# Patient Record
Sex: Male | Born: 1960 | Race: White | Hispanic: No | Marital: Married | Smoking: Never smoker
Health system: Southern US, Community
[De-identification: ages and names within clinical notes are randomized; demographics above are authoritative.]

## PROBLEM LIST (undated history)

## (undated) DIAGNOSIS — N200 Calculus of kidney: Secondary | ICD-10-CM

## (undated) DIAGNOSIS — I10 Essential (primary) hypertension: Secondary | ICD-10-CM

## (undated) DIAGNOSIS — Z87820 Personal history of traumatic brain injury: Secondary | ICD-10-CM

## (undated) HISTORY — DX: Personal history of traumatic brain injury: Z87.820

## (undated) HISTORY — PX: KIDNEY STONE SURGERY: SHX686

## (undated) HISTORY — DX: Essential (primary) hypertension: I10

---

## 2005-11-11 ENCOUNTER — Emergency Department (HOSPITAL_COMMUNITY): Admission: EM | Admit: 2005-11-11 | Discharge: 2005-11-11 | Payer: Self-pay | Admitting: Emergency Medicine

## 2009-06-19 ENCOUNTER — Emergency Department (HOSPITAL_COMMUNITY): Admission: EM | Admit: 2009-06-19 | Discharge: 2009-06-19 | Payer: Self-pay | Admitting: Emergency Medicine

## 2009-06-27 ENCOUNTER — Ambulatory Visit (HOSPITAL_COMMUNITY): Admission: RE | Admit: 2009-06-27 | Discharge: 2009-06-27 | Payer: Self-pay | Admitting: Urology

## 2010-07-22 LAB — DIFFERENTIAL
Eosinophils Absolute: 0 10*3/uL (ref 0.0–0.7)
Lymphs Abs: 0.4 10*3/uL — ABNORMAL LOW (ref 0.7–4.0)
Monocytes Relative: 4 % (ref 3–12)
Neutro Abs: 9.7 10*3/uL — ABNORMAL HIGH (ref 1.7–7.7)
Neutrophils Relative %: 92 % — ABNORMAL HIGH (ref 43–77)

## 2010-07-22 LAB — BASIC METABOLIC PANEL
Calcium: 8.9 mg/dL (ref 8.4–10.5)
Chloride: 107 mEq/L (ref 96–112)
Glucose, Bld: 154 mg/dL — ABNORMAL HIGH (ref 70–99)
Potassium: 4 mEq/L (ref 3.5–5.1)
Sodium: 137 mEq/L (ref 135–145)

## 2010-07-22 LAB — URINALYSIS, ROUTINE W REFLEX MICROSCOPIC
Specific Gravity, Urine: 1.016 (ref 1.005–1.030)
pH: 5.5 (ref 5.0–8.0)

## 2010-07-22 LAB — CBC
HCT: 43.4 % (ref 39.0–52.0)
MCV: 89.2 fL (ref 78.0–100.0)
RBC: 4.87 MIL/uL (ref 4.22–5.81)

## 2013-09-09 ENCOUNTER — Encounter (HOSPITAL_COMMUNITY): Payer: Self-pay | Admitting: Pharmacy Technician

## 2013-09-10 ENCOUNTER — Encounter (HOSPITAL_COMMUNITY): Payer: Self-pay

## 2013-09-10 ENCOUNTER — Encounter (HOSPITAL_COMMUNITY)
Admission: RE | Admit: 2013-09-10 | Discharge: 2013-09-10 | Disposition: A | Discharge status: Home / Self Care | Payer: PRIVATE HEALTH INSURANCE | Source: Ambulatory Visit | Attending: Orthopedic Surgery | Admitting: Orthopedic Surgery

## 2013-09-10 DIAGNOSIS — Z01812 Encounter for preprocedural laboratory examination: Secondary | ICD-10-CM | POA: Insufficient documentation

## 2013-09-10 LAB — CBC
HEMATOCRIT: 43.2 % (ref 39.0–52.0)
HEMOGLOBIN: 15 g/dL (ref 13.0–17.0)
MCH: 30.4 pg (ref 26.0–34.0)
MCHC: 34.7 g/dL (ref 30.0–36.0)
MCV: 87.4 fL (ref 78.0–100.0)
PLATELETS: 177 10*3/uL (ref 150–400)
RBC: 4.94 MIL/uL (ref 4.22–5.81)
RDW: 12.6 % (ref 11.5–15.5)
WBC: 3.8 10*3/uL — AB (ref 4.0–10.5)

## 2013-09-10 LAB — BASIC METABOLIC PANEL
BUN: 14 mg/dL (ref 6–23)
CALCIUM: 9.2 mg/dL (ref 8.4–10.5)
CO2: 26 meq/L (ref 19–32)
CREATININE: 0.93 mg/dL (ref 0.50–1.35)
Chloride: 105 mEq/L (ref 96–112)
Glucose, Bld: 101 mg/dL — ABNORMAL HIGH (ref 70–99)
POTASSIUM: 4.4 meq/L (ref 3.7–5.3)
Sodium: 141 mEq/L (ref 137–147)

## 2013-09-10 LAB — TYPE AND SCREEN
ABO/RH(D): O NEG
ANTIBODY SCREEN: NEGATIVE

## 2013-09-10 NOTE — Progress Notes (Signed)
Patient denied having any cardiac or pulmonary issues. Patient also denied having a PCP.

## 2013-09-10 NOTE — Pre-Procedure Instructions (Signed)
Jerry Turner  09/10/2013   Your procedure is scheduled on:  Friday Sep 20, 2013 at 10:30 AM.  [facility] Short Stay Entrance "A"  Admitting at 8:30 AM.  Call this number if you have problems the morning of surgery: [phone]   Remember:   Do not eat food or drink liquids after midnight.   Take these medicines the morning of surgery with A SIP OF WATER: NONE   Do not wear jewelry.  Do not wear lotions, powders, or colgone. You may NOT wear deodorant.  Men may shave face and neck.  Do not bring valuables to the hospital.  [facility] is not responsible for any belongings or valuables.               Contacts, dentures or bridgework may not be worn into surgery.  Leave suitcase in the car. After surgery it may be brought to your room.  For patients admitted to the hospital, discharge time is determined by your treatment team.               Patients discharged the day of surgery will not be allowed to drive home.  Name and phone number of your driver: Family/Friend  Special Instructions: Shower using CHG soap the night before and the morning of your surgery   Please read over the following fact sheets that you were given: Pain Booklet, Coughing and Deep Breathing, Blood Transfusion Information and Surgical Site Infection Prevention

## 2013-09-11 LAB — ABO/RH: ABO/RH(D): O NEG

## 2013-09-11 NOTE — H&P (Signed)
Jerry Turner is an 53 y.o. male.    Chief Complaint: right shoulder pain  HPI: Pt is a 53 y.o. male complaining of right shoulder pain for multiple years. Pain had continually increased since the beginning. X-rays in the clinic show end-stage arthritic changes of the right shoulder. Pt has tried various conservative treatments which have failed to alleviate their symptoms, including injections and therapy. Various options are discussed with the patient. Risks, benefits and expectations were discussed with the patient. Patient understand the risks, benefits and expectations and wishes to proceed with surgery.   PCP:  No primary provider on file.  D/C Plans:  Home with HHPT  PMH: No past medical history on file.  PSH: Past Surgical History  Procedure Laterality Date  . Kidney stone surgery      Social History:  reports that he has never smoked. He does not have any smokeless tobacco history on file. He reports that he drinks alcohol. He reports that he does not use illicit drugs.  Allergies:  No Known Allergies  Medications: No current facility-administered medications for this encounter.   No current outpatient prescriptions on file.    Results for orders placed during the hospital encounter of 09/10/13 (from the past 48 hour(s))  CBC     Status: Abnormal   Collection Time    09/10/13 12:28 PM      Result Value Ref Range   WBC 3.8 (*) 4.0 - 10.5 K/uL   RBC 4.94  4.22 - 5.81 MIL/uL   Hemoglobin 15.0  13.0 - 17.0 g/dL   HCT 43.2  39.0 - 52.0 %   MCV 87.4  78.0 - 100.0 fL   MCH 30.4  26.0 - 34.0 pg   MCHC 34.7  30.0 - 36.0 g/dL   RDW 12.6  11.5 - 15.5 %   Platelets 177  150 - 400 K/uL  BASIC METABOLIC PANEL     Status: Abnormal   Collection Time    09/10/13 12:28 PM      Result Value Ref Range   Sodium 141  137 - 147 mEq/L   Potassium 4.4  3.7 - 5.3 mEq/L   Chloride 105  96 - 112 mEq/L   CO2 26  19 - 32 mEq/L   Glucose, Bld 101 (*) 70 - 99 mg/dL   BUN 14  6 - 23  mg/dL   Creatinine, Ser 0.93  0.50 - 1.35 mg/dL   Calcium 9.2  8.4 - 10.5 mg/dL   GFR calc non Af Amer >90  >90 mL/min   GFR calc Af Amer >90  >90 mL/min   Comment: (NOTE)     The eGFR has been calculated using the CKD EPI equation.     This calculation has not been validated in all clinical situations.     eGFR's persistently <90 mL/min signify possible Chronic Kidney     Disease.  TYPE AND SCREEN     Status: None   Collection Time    09/10/13 12:35 PM      Result Value Ref Range   ABO/RH(D) O NEG     Antibody Screen NEG     Sample Expiration 09/24/2013    ABO/RH     Status: None   Collection Time    09/10/13 12:35 PM      Result Value Ref Range   ABO/RH(D) O NEG     No results found.  ROS: Pain with rom of the right upper extremity  Physical Exam:  Alert  and oriented 53 y.o. male in no acute distress Cranial nerves 2-12 intact Cervical spine: full rom with no tenderness, nv intact distally Chest: active breath sounds bilaterally, no wheeze rhonchi or rales Heart: regular rate and rhythm, no murmur Abd: non tender non distended with active bowel sounds Hip is stable with rom  Right upper extremity with mild to moderate crepitus with rom Strength of ER and IR 5/5 bilaterally No rashes or edema  Assessment/Plan Assessment: right shoulder end stage osteoarthritis   Plan: Patient will undergo a total shoulder arthroplasty right by Dr. Veverly Fells at [facility]. Risks benefits and expectations were discussed with the patient. Patient understand risks, benefits and expectations and wishes to proceed.

## 2013-09-19 MED ORDER — CEFAZOLIN SODIUM-DEXTROSE 2-3 GM-% IV SOLR
2.0000 g | INTRAVENOUS | Status: AC
  Administered 2013-09-20: 2 g via INTRAVENOUS
  Filled 2013-09-19: qty 50

## 2013-09-19 MED ORDER — CHLORHEXIDINE GLUCONATE 4 % EX LIQD
60.0000 mL | Freq: Once | CUTANEOUS | Status: DC
Start: 2013-09-19 — End: 2013-09-20
  Filled 2013-09-19: qty 60

## 2013-09-20 ENCOUNTER — Encounter (HOSPITAL_COMMUNITY): Payer: Self-pay | Admitting: Certified Registered Nurse Anesthetist

## 2013-09-20 ENCOUNTER — Inpatient Hospital Stay (HOSPITAL_COMMUNITY)
Admission: RE | Admit: 2013-09-20 | Discharge: 2013-09-22 | DRG: 483 | Disposition: A | Discharge status: Home / Self Care | Payer: PRIVATE HEALTH INSURANCE | Source: Ambulatory Visit | Attending: Orthopedic Surgery | Admitting: Orthopedic Surgery

## 2013-09-20 ENCOUNTER — Ambulatory Visit (HOSPITAL_COMMUNITY): Payer: PRIVATE HEALTH INSURANCE | Admitting: Certified Registered Nurse Anesthetist

## 2013-09-20 ENCOUNTER — Inpatient Hospital Stay (HOSPITAL_COMMUNITY): Payer: PRIVATE HEALTH INSURANCE

## 2013-09-20 ENCOUNTER — Encounter (HOSPITAL_COMMUNITY): Payer: PRIVATE HEALTH INSURANCE | Admitting: Certified Registered Nurse Anesthetist

## 2013-09-20 ENCOUNTER — Encounter (HOSPITAL_COMMUNITY)
Admission: RE | Disposition: A | Discharge status: Home / Self Care | Payer: Self-pay | Source: Ambulatory Visit | Attending: Orthopedic Surgery

## 2013-09-20 DIAGNOSIS — M19019 Primary osteoarthritis, unspecified shoulder: Secondary | ICD-10-CM

## 2013-09-20 HISTORY — PX: TOTAL SHOULDER ARTHROPLASTY: SHX126

## 2013-09-20 HISTORY — DX: Primary osteoarthritis, unspecified shoulder: M19.019

## 2013-09-20 SURGERY — ARTHROPLASTY, SHOULDER, TOTAL
Anesthesia: Regional | Site: Shoulder | Laterality: Right

## 2013-09-20 MED ORDER — METOCLOPRAMIDE HCL 10 MG PO TABS: 5.0000 mg | ORAL_TABLET | Freq: Three times a day (TID) | ORAL | Status: DC | PRN

## 2013-09-20 MED ORDER — SUCCINYLCHOLINE CHLORIDE 20 MG/ML IJ SOLN
INTRAMUSCULAR | Status: DC | PRN
  Administered 2013-09-20: 120 mg via INTRAVENOUS

## 2013-09-20 MED ORDER — LACTATED RINGERS IV SOLN
INTRAVENOUS | Status: DC | PRN
  Administered 2013-09-20 (×2): via INTRAVENOUS

## 2013-09-20 MED ORDER — ONDANSETRON HCL 4 MG PO TABS
4.0000 mg | ORAL_TABLET | Freq: Four times a day (QID) | ORAL | Status: DC | PRN
  Administered 2013-09-21 – 2013-09-22 (×2): 4 mg via ORAL
  Filled 2013-09-20 (×2): qty 1

## 2013-09-20 MED ORDER — PHENOL 1.4 % MT LIQD: 1.0000 | OROMUCOSAL | Status: DC | PRN

## 2013-09-20 MED ORDER — METOCLOPRAMIDE HCL 5 MG/ML IJ SOLN
5.0000 mg | Freq: Three times a day (TID) | INTRAMUSCULAR | Status: DC | PRN
  Administered 2013-09-20: 10 mg via INTRAVENOUS
  Filled 2013-09-20: qty 2

## 2013-09-20 MED ORDER — HYDROMORPHONE HCL PF 1 MG/ML IJ SOLN
0.2500 mg | INTRAMUSCULAR | Status: DC | PRN
  Administered 2013-09-20 (×3): 0.5 mg via INTRAVENOUS

## 2013-09-20 MED ORDER — FENTANYL CITRATE 0.05 MG/ML IJ SOLN
100.0000 ug | Freq: Once | INTRAMUSCULAR | Status: AC
  Administered 2013-09-20: 100 ug via INTRAVENOUS
  Filled 2013-09-20: qty 2

## 2013-09-20 MED ORDER — MIDAZOLAM HCL 2 MG/2ML IJ SOLN
INTRAMUSCULAR | Status: AC
  Administered 2013-09-20: 2 mg
  Filled 2013-09-20: qty 2

## 2013-09-20 MED ORDER — PHENYLEPHRINE 40 MCG/ML (10ML) SYRINGE FOR IV PUSH (FOR BLOOD PRESSURE SUPPORT)
PREFILLED_SYRINGE | INTRAVENOUS | Status: AC
  Filled 2013-09-20: qty 10

## 2013-09-20 MED ORDER — MIDAZOLAM HCL 2 MG/2ML IJ SOLN
INTRAMUSCULAR | Status: AC
  Filled 2013-09-20: qty 2

## 2013-09-20 MED ORDER — SODIUM CHLORIDE 0.9 % IV SOLN
INTRAVENOUS | Status: DC
  Administered 2013-09-20: 18:00:00 via INTRAVENOUS

## 2013-09-20 MED ORDER — METHOCARBAMOL 1000 MG/10ML IJ SOLN: 500.0000 mg | Freq: Four times a day (QID) | INTRAVENOUS | Status: DC | PRN

## 2013-09-20 MED ORDER — PROMETHAZINE HCL 25 MG/ML IJ SOLN: 6.2500 mg | INTRAMUSCULAR | Status: DC | PRN

## 2013-09-20 MED ORDER — METHOCARBAMOL 500 MG PO TABS
500.0000 mg | ORAL_TABLET | Freq: Four times a day (QID) | ORAL | Status: DC | PRN
  Administered 2013-09-20 – 2013-09-22 (×6): 500 mg via ORAL
  Filled 2013-09-20 (×7): qty 1

## 2013-09-20 MED ORDER — ONDANSETRON HCL 4 MG/2ML IJ SOLN
INTRAMUSCULAR | Status: DC | PRN
  Administered 2013-09-20: 4 mg via INTRAVENOUS

## 2013-09-20 MED ORDER — CEFAZOLIN SODIUM-DEXTROSE 2-3 GM-% IV SOLR
2.0000 g | Freq: Four times a day (QID) | INTRAVENOUS | Status: AC
  Administered 2013-09-20 – 2013-09-21 (×3): 2 g via INTRAVENOUS
  Filled 2013-09-20 (×4): qty 50

## 2013-09-20 MED ORDER — PROPOFOL 10 MG/ML IV BOLUS
INTRAVENOUS | Status: DC | PRN
  Administered 2013-09-20: 200 mg via INTRAVENOUS

## 2013-09-20 MED ORDER — BUPIVACAINE-EPINEPHRINE 0.25% -1:200000 IJ SOLN
INTRAMUSCULAR | Status: DC | PRN
  Administered 2013-09-20: 8 mL

## 2013-09-20 MED ORDER — FENTANYL CITRATE 0.05 MG/ML IJ SOLN
INTRAMUSCULAR | Status: DC | PRN
  Administered 2013-09-20: 100 ug via INTRAVENOUS

## 2013-09-20 MED ORDER — MIDAZOLAM HCL 5 MG/ML IJ SOLN
2.0000 mg | Freq: Once | INTRAMUSCULAR | Status: AC
  Administered 2013-09-20: 2 mg via INTRAVENOUS

## 2013-09-20 MED ORDER — THROMBIN 5000 UNITS EX SOLR
OROMUCOSAL | Status: DC | PRN
  Administered 2013-09-20: 12:00:00 via TOPICAL

## 2013-09-20 MED ORDER — ROCURONIUM BROMIDE 50 MG/5ML IV SOLN
INTRAVENOUS | Status: AC
  Filled 2013-09-20: qty 1

## 2013-09-20 MED ORDER — HYDROMORPHONE HCL PF 1 MG/ML IJ SOLN
INTRAMUSCULAR | Status: AC
  Filled 2013-09-20: qty 1

## 2013-09-20 MED ORDER — METHOCARBAMOL 500 MG PO TABS: 500.0000 mg | ORAL_TABLET | Freq: Three times a day (TID) | ORAL | Status: DC | PRN

## 2013-09-20 MED ORDER — BUPIVACAINE-EPINEPHRINE (PF) 0.5% -1:200000 IJ SOLN
INTRAMUSCULAR | Status: DC | PRN
  Administered 2013-09-20: 30 mL

## 2013-09-20 MED ORDER — ACETAMINOPHEN 650 MG RE SUPP: 650.0000 mg | Freq: Four times a day (QID) | RECTAL | Status: DC | PRN

## 2013-09-20 MED ORDER — SUCCINYLCHOLINE CHLORIDE 20 MG/ML IJ SOLN
INTRAMUSCULAR | Status: AC
  Filled 2013-09-20: qty 1

## 2013-09-20 MED ORDER — OXYCODONE HCL 5 MG PO TABS: 5.0000 mg | ORAL_TABLET | Freq: Once | ORAL | Status: DC | PRN

## 2013-09-20 MED ORDER — METHOCARBAMOL 1000 MG/10ML IJ SOLN
500.0000 mg | INTRAVENOUS | Status: AC
  Administered 2013-09-20: 500 mg via INTRAVENOUS
  Filled 2013-09-20: qty 5

## 2013-09-20 MED ORDER — ONDANSETRON HCL 4 MG/2ML IJ SOLN
4.0000 mg | Freq: Four times a day (QID) | INTRAMUSCULAR | Status: DC | PRN
Start: 2013-09-20 — End: 2013-09-22
  Administered 2013-09-20: 4 mg via INTRAVENOUS
  Filled 2013-09-20: qty 2

## 2013-09-20 MED ORDER — BISACODYL 10 MG RE SUPP: 10.0000 mg | Freq: Every day | RECTAL | Status: DC | PRN

## 2013-09-20 MED ORDER — MENTHOL 3 MG MT LOZG: 1.0000 | LOZENGE | OROMUCOSAL | Status: DC | PRN

## 2013-09-20 MED ORDER — ONDANSETRON HCL 4 MG/2ML IJ SOLN
INTRAMUSCULAR | Status: AC
  Filled 2013-09-20: qty 2

## 2013-09-20 MED ORDER — LACTATED RINGERS IV SOLN
INTRAVENOUS | Status: DC
  Administered 2013-09-20: 09:00:00 via INTRAVENOUS

## 2013-09-20 MED ORDER — OXYCODONE-ACETAMINOPHEN 7.5-325 MG PO TABS: 1.0000 | ORAL_TABLET | ORAL | Status: DC | PRN

## 2013-09-20 MED ORDER — EPHEDRINE SULFATE 50 MG/ML IJ SOLN
INTRAMUSCULAR | Status: DC | PRN
  Administered 2013-09-20: 5 mg via INTRAVENOUS
  Administered 2013-09-20 (×3): 10 mg via INTRAVENOUS

## 2013-09-20 MED ORDER — BUPIVACAINE-EPINEPHRINE (PF) 0.25% -1:200000 IJ SOLN
INTRAMUSCULAR | Status: AC
  Filled 2013-09-20: qty 30

## 2013-09-20 MED ORDER — OXYCODONE HCL 5 MG PO TABS
5.0000 mg | ORAL_TABLET | ORAL | Status: DC | PRN
  Administered 2013-09-20 (×2): 5 mg via ORAL
  Administered 2013-09-21 – 2013-09-22 (×9): 10 mg via ORAL
  Filled 2013-09-20 (×5): qty 2
  Filled 2013-09-20: qty 1
  Filled 2013-09-20 (×2): qty 2
  Filled 2013-09-20: qty 1
  Filled 2013-09-20 (×2): qty 2

## 2013-09-20 MED ORDER — THROMBIN 5000 UNITS EX SOLR
CUTANEOUS | Status: AC
  Filled 2013-09-20: qty 5000

## 2013-09-20 MED ORDER — SODIUM CHLORIDE 0.9 % IR SOLN
Status: DC | PRN
  Administered 2013-09-20 (×2): 1000 mL

## 2013-09-20 MED ORDER — FENTANYL CITRATE 0.05 MG/ML IJ SOLN
INTRAMUSCULAR | Status: AC
  Filled 2013-09-20: qty 5

## 2013-09-20 MED ORDER — OXYCODONE HCL 5 MG/5ML PO SOLN: 5.0000 mg | Freq: Once | ORAL | Status: DC | PRN

## 2013-09-20 MED ORDER — ACETAMINOPHEN 325 MG PO TABS
650.0000 mg | ORAL_TABLET | Freq: Four times a day (QID) | ORAL | Status: DC | PRN
  Administered 2013-09-21: 650 mg via ORAL
  Filled 2013-09-20: qty 2

## 2013-09-20 MED ORDER — PROPOFOL 10 MG/ML IV BOLUS
INTRAVENOUS | Status: AC
  Filled 2013-09-20: qty 20

## 2013-09-20 SURGICAL SUPPLY — 79 items
BLADE SAW SAG 73X25 THK (BLADE) ×1
BLADE SAW SGTL 73X25 THK (BLADE) ×1 IMPLANT
BODY PROX SHOULDER SZ 14-135 (Shoulder) ×1 IMPLANT
BUR SURG 4X8 MED (BURR) IMPLANT
BURR SURG 4X8 MED (BURR) ×2
CEMENT BONE DEPUY (Cement) ×2 IMPLANT
CLSR STERI-STRIP ANTIMIC 1/2X4 (GAUZE/BANDAGES/DRESSINGS) ×1 IMPLANT
COVER SURGICAL LIGHT HANDLE (MISCELLANEOUS) ×2 IMPLANT
DRAPE INCISE IOBAN 66X45 STRL (DRAPES) ×5 IMPLANT
DRAPE U-SHAPE 47X51 STRL (DRAPES) ×2 IMPLANT
DRAPE X-RAY CASS 24X20 (DRAPES) IMPLANT
DRILL BIT 5/64 (BIT) ×2 IMPLANT
DRSG ADAPTIC 3X8 NADH LF (GAUZE/BANDAGES/DRESSINGS) ×2 IMPLANT
DRSG PAD ABDOMINAL 8X10 ST (GAUZE/BANDAGES/DRESSINGS) ×3 IMPLANT
DURAPREP 26ML APPLICATOR (WOUND CARE) ×3 IMPLANT
ELECT BLADE 4.0 EZ CLEAN MEGAD (MISCELLANEOUS) ×2
ELECT NDL TIP 2.8 STRL (NEEDLE) ×1 IMPLANT
ELECT NEEDLE TIP 2.8 STRL (NEEDLE) ×2 IMPLANT
ELECT REM PT RETURN 9FT ADLT (ELECTROSURGICAL) ×2
ELECTRODE BLDE 4.0 EZ CLN MEGD (MISCELLANEOUS) ×1 IMPLANT
ELECTRODE REM PT RTRN 9FT ADLT (ELECTROSURGICAL) ×1 IMPLANT
GLENOID ANCHOR PEG CROSSLK 48 (Orthopedic Implant) ×1 IMPLANT
GLOVE BIOGEL PI IND STRL 7.0 (GLOVE) IMPLANT
GLOVE BIOGEL PI INDICATOR 7.0 (GLOVE) ×2
GLOVE BIOGEL PI ORTHO PRO 7.5 (GLOVE) ×1
GLOVE BIOGEL PI ORTHO PRO SZ7 (GLOVE) ×1
GLOVE BIOGEL PI ORTHO PRO SZ8 (GLOVE) ×2
GLOVE ORTHO TXT STRL SZ7.5 (GLOVE) ×2 IMPLANT
GLOVE PI ORTHO PRO STRL 7.5 (GLOVE) ×1 IMPLANT
GLOVE PI ORTHO PRO STRL SZ7 (GLOVE) IMPLANT
GLOVE PI ORTHO PRO STRL SZ8 (GLOVE) ×1 IMPLANT
GLOVE SURG ORTHO 8.5 STRL (GLOVE) ×5 IMPLANT
GOWN STRL REUS W/ TWL XL LVL3 (GOWN DISPOSABLE) ×3 IMPLANT
GOWN STRL REUS W/TWL XL LVL3 (GOWN DISPOSABLE) ×8
HANDPIECE INTERPULSE COAX TIP (DISPOSABLE)
HEAD ECC HUMERAL SZ 48MMX18MM (Head) ×1 IMPLANT
KIT BASIN OR (CUSTOM PROCEDURE TRAY) ×2 IMPLANT
KIT ROOM TURNOVER OR (KITS) ×2 IMPLANT
MANIFOLD NEPTUNE II (INSTRUMENTS) ×1 IMPLANT
NDL 1/2 CIR MAYO (NEEDLE) ×1 IMPLANT
NDL HYPO 25GX1X1/2 BEV (NEEDLE) ×1 IMPLANT
NDL SUT 6 .5 CRC .975X.05 MAYO (NEEDLE) ×1 IMPLANT
NEEDLE 1/2 CIR MAYO (NEEDLE) ×2 IMPLANT
NEEDLE HYPO 25GX1X1/2 BEV (NEEDLE) ×2 IMPLANT
NEEDLE MAYO TAPER (NEEDLE) ×2
NS IRRIG 1000ML POUR BTL (IV SOLUTION) ×2 IMPLANT
PACK SHOULDER (CUSTOM PROCEDURE TRAY) ×2 IMPLANT
PAD ABD 8X10 STRL (GAUZE/BANDAGES/DRESSINGS) ×1 IMPLANT
PAD ARMBOARD 7.5X6 YLW CONV (MISCELLANEOUS) ×4 IMPLANT
SET HNDPC FAN SPRY TIP SCT (DISPOSABLE) IMPLANT
SLING ARM IMMOBILIZER LRG (SOFTGOODS) ×2 IMPLANT
SLING ARM IMMOBILIZER MED (SOFTGOODS) IMPLANT
SMARTMIX MINI TOWER (MISCELLANEOUS) ×2
SPONGE GAUZE 4X4 12PLY (GAUZE/BANDAGES/DRESSINGS) ×2 IMPLANT
SPONGE GAUZE 4X4 12PLY STER LF (GAUZE/BANDAGES/DRESSINGS) ×1 IMPLANT
SPONGE LAP 18X18 X RAY DECT (DISPOSABLE) ×2 IMPLANT
SPONGE LAP 4X18 X RAY DECT (DISPOSABLE) ×2 IMPLANT
SPONGE SURGIFOAM ABS GEL SZ50 (HEMOSTASIS) ×1 IMPLANT
STEM GLOBAL UNITE 14MM PORO 11 (Joint) ×1 IMPLANT
STRIP CLOSURE SKIN 1/2X4 (GAUZE/BANDAGES/DRESSINGS) ×2 IMPLANT
SUCTION FRAZIER TIP 10 FR DISP (SUCTIONS) ×3 IMPLANT
SUT FIBERWIRE #2 38 T-5 BLUE (SUTURE) ×6
SUT MNCRL AB 4-0 PS2 18 (SUTURE) ×2 IMPLANT
SUT VIC AB 0 CT1 27 (SUTURE) ×2
SUT VIC AB 0 CT1 27XBRD ANBCTR (SUTURE) ×1 IMPLANT
SUT VIC AB 0 CT2 27 (SUTURE) ×1 IMPLANT
SUT VIC AB 2-0 CT1 27 (SUTURE) ×2
SUT VIC AB 2-0 CT1 TAPERPNT 27 (SUTURE) ×1 IMPLANT
SUT VICRYL AB 2 0 TIES (SUTURE) ×1 IMPLANT
SUTURE FIBERWR #2 38 T-5 BLUE (SUTURE) ×2 IMPLANT
SYR CONTROL 10ML LL (SYRINGE) ×3 IMPLANT
TAPE CLOTH SURG 4X10 WHT LF (GAUZE/BANDAGES/DRESSINGS) ×1 IMPLANT
TOWEL OR 17X24 6PK STRL BLUE (TOWEL DISPOSABLE) ×2 IMPLANT
TOWEL OR 17X26 10 PK STRL BLUE (TOWEL DISPOSABLE) ×2 IMPLANT
TOWER SMARTMIX MINI (MISCELLANEOUS) ×1 IMPLANT
TRAY FOLEY CATH 16FRSI W/METER (SET/KITS/TRAYS/PACK) ×1 IMPLANT
TUBE SUCT ARGYLE STRL (TUBING) ×1 IMPLANT
WATER STERILE IRR 1000ML POUR (IV SOLUTION) ×2 IMPLANT
YANKAUER SUCT BULB TIP NO VENT (SUCTIONS) ×1 IMPLANT

## 2013-09-20 NOTE — Anesthesia Procedure Notes (Addendum)
Anesthesia Regional Block:  Interscalene brachial plexus block  Pre-Anesthetic Checklist: ,, timeout performed, Correct Patient, Correct Site, Correct Laterality, Correct Procedure,, site marked, risks and benefits discussed, Surgical consent,  Pre-op evaluation,  At surgeon's request and post-op pain management  Laterality: Right  Prep: chloraprep       Needles:  Injection technique: Single-shot  Needle Type: Echogenic Stimulator Needle     Needle Length: 5cm 5 cm Needle Gauge: 22 and 22 G    Additional Needles:  Procedures: ultrasound guided (picture in chart) and nerve stimulator Interscalene brachial plexus block  Nerve Stimulator or Paresthesia:  Response: bicep contraction, 0.48 mA,   Additional Responses:   Narrative:  Start time: 09/20/2013 9:57 AM End time: 09/20/2013 10:04 AM Injection made incrementally with aspirations every 5 mL.  Performed by: Personally   Additional Notes: Functioning IV was confirmed and monitors applied.  A 28mm 22ga echogenic arrow stimulator was used. Sterile prep and drape,hand hygiene and sterile gloves were used.Ultrasound guidance: relevent anatomy identified, needle position confirmed, local anesthetic spread visualized around nerve(s)., vascular puncture avoided.  Image printed for medical record.  Negative aspiration and negative test dose prior to incremental administration of local anesthetic. The patient tolerated the procedure well.   Procedure Name: Intubation Date/Time: 09/20/2013 11:10 AM Performed by: Rogelia Boga Pre-anesthesia Checklist: Patient identified, Emergency Drugs available, Suction available, Patient being monitored and Timeout performed Patient Re-evaluated:Patient Re-evaluated prior to inductionOxygen Delivery Method: Circle system utilized Preoxygenation: Pre-oxygenation with 100% oxygen Intubation Type: IV induction Laryngoscope Size: Mac and 4 Grade View: Grade II Tube type: Oral Tube size: 7.5  mm Number of attempts: 1 Airway Equipment and Method: Stylet Placement Confirmation: ETT inserted through vocal cords under direct vision,  positive ETCO2 and breath sounds checked- equal and bilateral Secured at: 23 cm Tube secured with: Tape Dental Injury: Teeth and Oropharynx as per pre-operative assessment

## 2013-09-20 NOTE — Transfer of Care (Signed)
Immediate Anesthesia Transfer of Care Note  Patient: Jerry Turner  Procedure(s) Performed: Procedure(s): RIGHT TOTAL SHOULDER ARTHROPLASTY (Right)  Patient Location: PACU  Anesthesia Type:MAC combined with regional for post-op pain  Level of Consciousness: awake and patient cooperative  Airway & Oxygen Therapy: Patient Spontanous Breathing and Patient connected to nasal cannula oxygen  Post-op Assessment: Report given to PACU RN, Post -op Vital signs reviewed and stable and Patient moving all extremities  Post vital signs: Reviewed and stable  Complications: No apparent anesthesia complications

## 2013-09-20 NOTE — Brief Op Note (Signed)
09/20/2013  2:17 PM  PATIENT:  Jerry Turner  53 y.o. male  PRE-OPERATIVE DIAGNOSIS:  right shoulder severe osteoarthritis, end stage  POST-OPERATIVE DIAGNOSIS:  right shoulder severe osteoarthritis, end stage  PROCEDURE:  Procedure(s): RIGHT TOTAL SHOULDER ARTHROPLASTY (Right) [employer]  SURGEON:  Surgeon(s) and Role:    * Verlee Rossetti, MD - Primary  PHYSICIAN ASSISTANT:   ASSISTANTS: Thea Gist., PA-C   ANESTHESIA:   regional and general  EBL:  Total I/O In: 1000 [I.V.:1000] Out: 100 [Blood:100]  BLOOD ADMINISTERED:none  DRAINS: none   LOCAL MEDICATIONS USED:  MARCAINE     SPECIMEN:  No Specimen  DISPOSITION OF SPECIMEN:  N/A  COUNTS:  YES  TOURNIQUET:  * No tourniquets in log *  DICTATION: .Other Dictation: Dictation Number [id number]  PLAN OF CARE: Admit to inpatient   PATIENT DISPOSITION:  PACU - hemodynamically stable.   Delay start of Pharmacological VTE agent (>24hrs) due to surgical blood loss or risk of bleeding: yes

## 2013-09-20 NOTE — Plan of Care (Signed)
Problem: Consults Goal: Diagnosis- Total Joint Replacement Right shoulder 5/22

## 2013-09-20 NOTE — Anesthesia Postprocedure Evaluation (Signed)
Anesthesia Post Note  Patient: Jerry Turner  Procedure(s) Performed: Procedure(s) (LRB): RIGHT TOTAL SHOULDER ARTHROPLASTY (Right)  Anesthesia type: general  Patient location: PACU  Post pain: Pain level controlled  Post assessment: Patient's Cardiovascular Status Stable  Last Vitals:  Filed Vitals:   09/20/13 1433  BP: 135/78  Pulse: 58  Temp:   Resp: 18    Post vital signs: Reviewed and stable  Level of consciousness: sedated  Complications: No apparent anesthesia complications

## 2013-09-20 NOTE — Anesthesia Preprocedure Evaluation (Addendum)
Anesthesia Evaluation  Patient identified by MRN, date of birth, ID band Patient awake    Reviewed: Allergy & Precautions, H&P , NPO status , Patient's Chart, lab work & pertinent test results  Airway Mallampati: II TM Distance: >3 FB Neck ROM: Full    Dental  (+) Teeth Intact, Dental Advisory Given   Pulmonary neg pulmonary ROS,  breath sounds clear to auscultation        Cardiovascular negative cardio ROS      Neuro/Psych negative neurological ROS  negative psych ROS   GI/Hepatic negative GI ROS, Neg liver ROS,   Endo/Other  negative endocrine ROS  Renal/GU negative Renal ROS     Musculoskeletal   Abdominal   Peds  Hematology   Anesthesia Other Findings   Reproductive/Obstetrics negative OB ROS                          Anesthesia Physical Anesthesia Plan  ASA: I  Anesthesia Plan: General ETT and Regional   Post-op Pain Management: MAC Combined w/ Regional for Post-op pain   Induction: Intravenous  Airway Management Planned: Oral ETT  Additional Equipment:   Intra-op Plan:   Post-operative Plan: Extubation in OR  Informed Consent: I have reviewed the patients History and Physical, chart, labs and discussed the procedure including the risks, benefits and alternatives for the proposed anesthesia with the patient or authorized representative who has indicated his/her understanding and acceptance.   Dental advisory given  Plan Discussed with: Anesthesiologist, CRNA and Surgeon  Anesthesia Plan Comments:        Anesthesia Quick Evaluation

## 2013-09-20 NOTE — Discharge Instructions (Signed)
Ice to the shoulder constantly,  Do exercises every couple of hours, lap slides, door hinge exercises, (internal and external rotation), and pendulums.  Keep the incision clean and dry for one week, then ok to get wet in the shower..  Do not lift push or pull with the right arm.  Use the sling out of the home.  Follow up in the office in two weeks.  [phone]

## 2013-09-20 NOTE — Interval H&P Note (Signed)
History and Physical Interval Note:  09/20/2013 10:26 AM  Jerry Turner  has presented today for surgery, with the diagnosis of right shoulder severe osteoarthritis  The various methods of treatment have been discussed with the patient and family. After consideration of risks, benefits and other options for treatment, the patient has consented to  Procedure(s): RIGHT TOTAL SHOULDER ARTHROPLASTY (Right) as a surgical intervention .  The patient's history has been reviewed, patient examined, no change in status, stable for surgery.  I have reviewed the patient's chart and labs.  Questions were answered to the patient's satisfaction.     Verlee Rossetti

## 2013-09-21 ENCOUNTER — Encounter (HOSPITAL_COMMUNITY): Payer: Self-pay | Admitting: *Deleted

## 2013-09-21 LAB — BASIC METABOLIC PANEL
BUN: 13 mg/dL (ref 6–23)
CHLORIDE: 101 meq/L (ref 96–112)
CO2: 24 mEq/L (ref 19–32)
CREATININE: 1.03 mg/dL (ref 0.50–1.35)
Calcium: 8.1 mg/dL — ABNORMAL LOW (ref 8.4–10.5)
GFR, EST NON AFRICAN AMERICAN: 82 mL/min — AB (ref >90)
Glucose, Bld: 138 mg/dL — ABNORMAL HIGH (ref 70–99)
Potassium: 3.6 mEq/L — ABNORMAL LOW (ref 3.7–5.3)
Sodium: 137 mEq/L (ref 137–147)

## 2013-09-21 LAB — HEMOGLOBIN AND HEMATOCRIT, BLOOD
HCT: 36.9 % — ABNORMAL LOW (ref 39.0–52.0)
HEMOGLOBIN: 12.8 g/dL — AB (ref 13.0–17.0)

## 2013-09-21 MED ORDER — HYDROMORPHONE HCL PF 1 MG/ML IJ SOLN
1.0000 mg | INTRAMUSCULAR | Status: DC | PRN
  Administered 2013-09-21 (×2): 1 mg via INTRAVENOUS
  Filled 2013-09-21: qty 1

## 2013-09-21 MED ORDER — HYDROMORPHONE HCL PF 1 MG/ML IJ SOLN
INTRAMUSCULAR | Status: AC
  Filled 2013-09-21: qty 1

## 2013-09-21 NOTE — Progress Notes (Signed)
Occupational Therapy Treatment Patient Details Name: Jerry Turner MRN: [id number] DOB: [birth year] Today's Date: 09/21/2013    History of present illness R TSA   OT comments  Seen this pm for additional session for RUE A/AAROM shoulder as tolerated and A/AAROM elbow. Completed education with wife regarding protocol and compensatory techniques for ADL. Will see pt again in am for exercise prior to D/C. Pt to follow up with Dr. Ranell PatrickNorris and set up outpt therapy as needed.   Follow Up Recommendations  Outpatient OT;Supervision - Intermittent (to follow up with MD and therapy as needed)    Equipment Recommendations  None recommended by OT    Recommendations for Other Services      Precautions / Restrictions Precautions Precautions: Shoulder Type of Shoulder Precautions: Norris protocol Shoulder Interventions: Shoulder sling/immobilizer;Off for dressing/bathing/exercises Precaution Booklet Issued: Yes (comment) Restrictions Weight Bearing Restrictions: Yes RUE Weight Bearing: Non weight bearing       Mobility Bed Mobility Overal bed mobility: Modified Independent                Transfers Overall transfer level: Modified independent                    Balance Overall balance assessment: No apparent balance deficits (not formally assessed)                                 ADL                                         General ADL Comments: Educated pt/fmaily on compensatory techniques for bathing/dressing/ Family verbalized understanding. Also educated on proper positioning of  RUE in bed and chair. Able to return demonstrate.      Vision                     Perception     Praxis      Cognition   Behavior During Therapy: [facility] for tasks assessed/performed Overall Cognitive Status: Within Functional Limits for tasks assessed                       Extremity/Trunk Assessment               Exercises  Donning/doffing shirt without moving shoulder: Caregiver independent with task Method for sponge bathing under operated UE: Caregiver independent with task Donning/doffing sling/immobilizer: Caregiver independent with task Correct positioning of sling/immobilizer: Caregiver independent with task;Independent Pendulum exercises (written home exercise program): Supervision/safety ROM for elbow, wrist and digits of operated UE: Supervision/safety Sling wearing schedule (on at all times/off for ADL's): Caregiver independent with task Proper positioning of operated UE when showering: Caregiver independent with task Positioning of UE while sleeping: Independent   Shoulder Instructions Shoulder Instructions Donning/doffing shirt without moving shoulder: Caregiver independent with task Method for sponge bathing under operated UE: Caregiver independent with task Donning/doffing sling/immobilizer: Caregiver independent with task Correct positioning of sling/immobilizer: Caregiver independent with task;Independent Pendulum exercises (written home exercise program): Supervision/safety ROM for elbow, wrist and digits of operated UE: Supervision/safety Sling wearing schedule (on at all times/off for ADL's): Caregiver independent with task Proper positioning of operated UE when showering: Caregiver independent with task Positioning of UE while sleeping: Independent     General Comments      Pertinent Vitals/ Pain  6/10 - repositioned. nsg aware. Ice to shoulder.  Home Living Family/patient expects to be discharged to:: Private residence                                        Prior Functioning/Environment              Frequency Min 3X/week     Progress Toward Goals  OT Goals(current goals can now be found in the care plan section)  Progress towards OT goals: Progressing toward goals  Acute Rehab OT Goals Patient Stated Goal: to use my arm OT Goal Formulation: With  patient Time For Goal Achievement: 10/05/13 Potential to Achieve Goals: Good ADL Goals Pt Will Perform Upper Body Bathing: with caregiver independent in assisting;with supervision Pt Will Perform Upper Body Dressing: with supervision;with caregiver independent in assisting Pt/caregiver will Perform Home Exercise Program: Right Upper extremity;Increased ROM;With written HEP provided Additional ADL Goal #1: pt/family independent with positioning RUE in/out of sling  Plan Discharge plan remains appropriate    Co-evaluation                 End of Session     Activity Tolerance Patient tolerated treatment well Luisa Dago, OTR/L  [phone] 09/21/2013  Patient Left in bed;with call bell/phone within reach;with family/visitor present   Nurse Communication Mobility status;Other (comment);Patient requests pain meds        Time: 3329-5188 OT Time Calculation (min): 42 min  Charges: OT General Charges $OT Visit: 1 Procedure OT Treatments $Self Care/Home Management : 8-22 mins $Therapeutic Activity: 8-22 mins $Therapeutic Exercise: 8-22 mins  Lorinda Creed Styles Fambro 09/21/2013, 4:55 PM

## 2013-09-21 NOTE — Progress Notes (Signed)
Occupational Therapy Evaluation Patient Details Name: Jerry Turner MRN: [id number] DOB: [birth year] Today's Date: 09/21/2013    History of Present Illness R TSA   Clinical Impression   PTA, pt independent with ADL and mobility. Began education this am regarding TSA protocol. Began exercises. Able to achieve @ 10 degrees ER, 30 degrees FF - AAROM. Elbow flex/ext WFL AAROM. Began education regarding compensatory techniques. Pt given written handouts. Will review with pt's wife this pm. Pt will be appropriate for D/C after education session with wife if medically stable.  Pt should be able to complete HEP and follow up with outpt therapy.     Follow Up Recommendations  Outpatient OT;Supervision - Intermittent    Equipment Recommendations  None recommended by OT    Recommendations for Other Services       Precautions / Restrictions Precautions Precautions: Shoulder Type of Shoulder Precautions: Norris protocol Precaution Booklet Issued: Yes (comment) Restrictions Weight Bearing Restrictions: Yes      Mobility Bed Mobility Overal bed mobility: Modified Independent             General bed mobility comments: increased HOB  Transfers Overall transfer level: Modified independent                    Balance Overall balance assessment: No apparent balance deficits (not formally assessed)                                          ADL Overall ADL's : Needs assistance/impaired Eating/Feeding: Set up   Grooming: Moderate assistance   Upper Body Bathing: Moderate assistance   Lower Body Bathing: Sit to/from stand;Set up   Upper Body Dressing : Moderate assistance   Lower Body Dressing: Set up   Toilet Transfer: Supervision/safety Toilet Transfer Details (indicate cue type and reason): due to nauseaet feeling Toileting- Clothing Manipulation and Hygiene: Minimal assistance       Functional mobility during ADLs: Supervision/safety (due to  nausea/pain) General ADL Comments: Educated pt on cmopensatory techniques for ADL. precuations     Vision                     Perception     Praxis      Pertinent Vitals/Pain 7/10 shoulder. Premedicated Repositioned. Ice to shoulder     Hand Dominance Right   Extremity/Trunk Assessment Upper Extremity Assessment Upper Extremity Assessment: RUE deficits/detail RUE Deficits / Details: R TSA - able to do FF to 90. ER to 30. no abduction. numbness form nerve block. full fist/fine motor intact   Lower Extremity Assessment Lower Extremity Assessment: Overall WFL for tasks assessed   Cervical / Trunk Assessment Cervical / Trunk Assessment: Normal   Communication Communication Communication: No difficulties   Cognition Arousal/Alertness: Awake/alert Behavior During Therapy: WFL for tasks assessed/performed Overall Cognitive Status: Within Functional Limits for tasks assessed                     General Comments       Exercises Exercises: Shoulder     Shoulder Instructions Shoulder Instructions Donning/doffing shirt without moving shoulder: Moderate assistance Method for sponge bathing under operated UE: Moderate assistance Donning/doffing sling/immobilizer: Moderate assistance Correct positioning of sling/immobilizer: Minimal assistance Pendulum exercises (written home exercise program): Supervision/safety ROM for elbow, wrist and digits of operated UE: Supervision/safety Sling wearing schedule (on at all times/off for ADL's):  Supervision/safety Proper positioning of operated UE when showering: Supervision/safety Positioning of UE while sleeping: Supervision/safety    Home Living Family/patient expects to be discharged to:: Private residence Living Arrangements: Spouse/significant other;Children Available Help at Discharge: Available 24 hours/day (initially) Type of Home: House Home Access: Stairs to enter [employer] of Steps:  4 Entrance Stairs-Rails: None Home Layout: Two level;Bed/bath upstairs     Bathroom Shower/Tub: Producer, television/film/video: Standard Bathroom Accessibility: Yes How Accessible: Accessible via walker Home Equipment: Shower seat - built in          Prior Functioning/Environment Level of Independence: Independent             OT Diagnosis: Generalized weakness;Acute pain   OT Problem List: Decreased strength;Decreased range of motion;Decreased coordination;Decreased knowledge of precautions;Impaired UE functional use;Pain   OT Treatment/Interventions: Self-care/ADL training;Therapeutic exercise;Therapeutic activities;Patient/family education    OT Goals(Current goals can be found in the care plan section) Acute Rehab OT Goals Patient Stated Goal: to use my arm OT Goal Formulation: With patient Time For Goal Achievement: 10/05/13 Potential to Achieve Goals: Good  OT Frequency: Min 3X/week   Barriers to D/C:            Co-evaluation              End of Session Nurse Communication: Mobility status;Other (comment) (request for nausea meds)  Activity Tolerance: Patient tolerated treatment well Patient left: in chair;with call bell/phone within reach   Time: 0837-0927 OT Time Calculation (min): 50 min Charges:  OT General Charges $OT Visit: 1 Procedure OT Evaluation $Initial OT Evaluation Tier I: 1 Procedure OT Treatments $Self Care/Home Management : 8-22 mins $Therapeutic Activity: 23-37 mins G-Codes: OT G-codes **NOT FOR INPATIENT CLASS** Functional Assessment Tool Used: clinical judgement Functional Limitation: Self care Self Care Current Status (U9323): At least 40 percent but less than 60 percent impaired, limited or restricted Self Care Goal Status (F5732): At least 1 percent but less than 20 percent impaired, limited or restricted  [facility] 09/21/2013, 9:39 AM   Luisa Dago, OTR/L  [phone] 09/21/2013

## 2013-09-21 NOTE — Op Note (Signed)
NAMMarland Kitchen:  Jerry Turner, Jerry Turner              ACCOUNT NO.:  [id number]  MEDICAL RECORD NO.:  [id number]  LOCATION:  5N07C                        FACILITY:  [facility]  PHYSICIAN:  Almedia BallsSteven R. Ranell PatrickNorris, M.D. DATE OF BIRTH:  08/07/1960  DATE OF PROCEDURE:  09/20/2013 DATE OF DISCHARGE:                              OPERATIVE REPORT   PREOPERATIVE DIAGNOSIS:  Right shoulder end-stage osteoarthritis.  POSTOPERATIVE DIAGNOSIS:  Right shoulder end-stage osteoarthritis.  PROCEDURE PERFORMED:  Right total shoulder arthroplasty using DePuy Global Unite prosthesis.  ATTENDING SURGEON:  Almedia BallsSteven R. Ranell PatrickNorris, M.D.  ASSISTANT:  Donnie Coffinhomas B. Dixon, PA-C, who scrubbed the entire procedure and necessary for satisfactory completion of surgery.  ANESTHESIA:  General anesthesia was used plus interscalene block.  ESTIMATED BLOOD LOSS:  100 mL.  FLUID REPLACEMENT:  1500 mL of crystalloid.  INSTRUMENT COUNTS:  Correct.  COMPLICATIONS:  There were no complications.  ANTIBIOTICS:  Perioperative antibiotics were given.  INDICATIONS:  The patient is a 53 year old male with worsening right shoulder pain secondary to severe osteoarthritis.  The patient has evidence of superior glenoid wear including a double-tip deformity of the glenoid with some significant retroversion.  The patient has been managed conservatively with modification of activities, pain medications, injections and presents now for operative total shoulder arthroplasty to significantly improve range of motion, decrease his pain and improve his quality of life.  Informed consent obtained.  DESCRIPTION OF PROCEDURE:  After adequate level of anesthesia was achieved, the patient was positioned in modified beach-chair position. Right shoulder was correctly identified and examined under anesthesia. The patient had 20 degrees of internal rotation, 0 degrees of external rotation, forward elevation passively about 80.  We performed a sterile prep and drape of the  shoulder and arm, and time-out was called.  We entered the shoulder using standard deltopectoral incision started at the coracoid process extending down to the anterior humerus.  Dissection was carried out down through the subcutaneous tissues with the Bovie electrocautery.  We identified the cephalic vein, took it laterally to the deltoid and pectoralis was taken medially.  The conjoined tendon was taken medially and then we placed our deep retractors.  We went ahead at this point and released our subscapularis subperiosteally off the lesser tuberosity.  Placed #2 FiberWire suture in a modified Mason-Allen suture technique to repair the subscap at the end of surgery.  There was extensive osteophytes noted and synovial fluid.  We then went ahead and removed some large prominent osteophytes inferiorly and then placed our neck resection guide, set the elbow at the patient's side and externally rotated about 25 degrees for 25 degrees of retroversion.  We then went ahead and made our cut with a neck resection guide and oscillating saw. We removed the humeral head and removed bone graft from that head at the end of surgery.  We then went ahead and reamed the humeral shaft up to a size 14 and then broached with the size 14 broach for the Global Unite stem.  We then subluxed the humerus posteriorly, performed a 360-degree capsulectomy and the patient did have a significant double-tip deformity on the glenoid.  We went ahead at this point protecting the axillary nerve, placed our central  guidepin with respect to that double-tip deformity, trying to make sure we are perpendicular to the scapular body and actually used a bur anteriorly, knocked down that anterior side that was very prominent and also removed some osteophytes with large rongeur, and once we had our pin placement properly, then we reamed, normalizing the patient's version with the glenoid reamer for the 48 glenoid.  This was the APG  or anchor peg glenoid by DePuy.  Once we had it reamed, we drilled out our central peg hole, placed our peripheral peg hole guide and then drilled those at 12 o'clock and then the two inferior ones at 4 o'clock and 8 o'clock.  We were pleased with those peg hole locations, removed the drill guide and then placed our trial prosthesis in place. We replaced with that coverage, the version and security and bony support.  We then went ahead and placed epi-soaked Gelfoam in the three peripheral holes to dry those.  We then cemented the APG glenoid into place with just cementing the peripheral three holes and leaving that central hole free of any cement and I once that was in place and all the cement was hardened on the back table, we thoroughly irrigated, inspected, and made sure axillary nerve was in the free and clear and was in good condition.  We then went ahead and placed our drill holes in the lesser tuberosity for repair of the subscapularis, placed #2 FiberWire suture through that with a total of four suture strands coming out of the bone and the lesser.  We then went ahead and used impaction grafting technique and impacted the real Global Unite stem with a Porocoat proximally, set on about 25 degrees of retroversion.  Once that was in position, we trialed first with a 4818 head and felt like that will gave excellent coverage and soft tissue balance.  We then removed that trial, placing the real 4818 head.  We thoroughly irrigated the shoulder, reduced the shoulder, we were happy with the coverage and then repaired the subscap and rotator interval anatomically with a #2 FiberWire suture.  We had an anatomic repair of that subscap, ranged the shoulder, had nice passive motion.  We thoroughly irrigated and then closed in layers with 0 Vicryl suture for the muscular layer, basically in the deltopectoral interval, 2-0 Vicryl subcutaneous closure and 4-0 running Monocryl for skin.   Steri-Strips applied followed by sterile dressing.  The patient tolerated the procedure well, was placed in a shoulder sling.     Almedia Balls. Ranell Patrick, M.D.     SRN/MEDQ  D:  09/20/2013  T:  09/21/2013  Job:  [id number]

## 2013-09-21 NOTE — Progress Notes (Signed)
Subjective: 1 Day Post-Op Procedure(s) (LRB): RIGHT TOTAL SHOULDER ARTHROPLASTY (Right) Patient reports pain as 3 on 0-10 scale.  First Day Post-Op and no major problems. Should be ready for DC Tomorrow.  Objective: Vital signs in last 24 hours: Temp:  [97.7 F (36.5 C)-99.3 F (37.4 C)] 98.9 F (37.2 C) (05/23 0400) Pulse Rate:  [55-74] 65 (05/23 0400) Resp:  [14-22] 20 (05/23 0400) BP: (107-161)/(62-91) 107/62 mmHg (05/23 0400) SpO2:  [93 %-99 %] 95 % (05/23 0400)  Intake/Output from previous day: 05/22 0701 - 05/23 0700 In: 2300 [I.V.:2300] Out: 100 [Blood:100] Intake/Output this shift:     Recent Labs  09/21/13 0430  HGB 12.8*    Recent Labs  09/21/13 0430  HCT 36.9*    Recent Labs  09/21/13 0430  NA 137  K 3.6*  CL 101  CO2 24  BUN 13  CREATININE 1.03  GLUCOSE 138*  CALCIUM 8.1*   No results found for this basename: LABPT, INR,  in the last 72 hours  Neurologically intact  Assessment/Plan: 1 Day Post-Op Procedure(s) (LRB): RIGHT TOTAL SHOULDER ARTHROPLASTY (Right) Up with therapy discontinued Sunday.  Jacki Cones 09/21/2013, 9:37 AM

## 2013-09-22 NOTE — Progress Notes (Signed)
Subjective: 2 Days Post-Op Procedure(s) (LRB): RIGHT TOTAL SHOULDER ARTHROPLASTY (Right) Patient reports pain as mild.  Well controlled with oral pain meds.  Objective: Vital signs in last 24 hours: Temp:  [97 F (36.1 C)-100.3 F (37.9 C)] 99.7 F (37.6 C) (05/24 0459) Pulse Rate:  [68-69] 69 (05/24 0459) Resp:  [18-19] 18 (05/24 0459) BP: (124-137)/(74-81) 124/76 mmHg (05/24 0459) SpO2:  [93 %-96 %] 93 % (05/24 0459) Weight:  [93.895 kg (207 lb)] 93.895 kg (207 lb) (05/23 1500)  Intake/Output from previous day: 05/23 0701 - 05/24 0700 In: 860 [P.O.:860] Out: -  Intake/Output this shift: Total I/O In: 320 [P.O.:320] Out: -    Recent Labs  09/21/13 0430  HGB 12.8*    Recent Labs  09/21/13 0430  HCT 36.9*    Recent Labs  09/21/13 0430  NA 137  K 3.6*  CL 101  CO2 24  BUN 13  CREATININE 1.03  GLUCOSE 138*  CALCIUM 8.1*   No results found for this basename: LABPT, INR,  in the last 72 hours  PE:  R UE incision C/D/I.  NVI at R UE.  Assessment/Plan: 2 Days Post-Op Procedure(s) (LRB): RIGHT TOTAL SHOULDER ARTHROPLASTY (Right) D/c home today.  Dressing changed.  Toni Arthurs 09/22/2013, 9:22 AM

## 2013-09-22 NOTE — Progress Notes (Signed)
Occupational Therapy Treatment Patient Details Name: Jerry Turner MRN: [id number] DOB: 02-28-1961 Today's Date: 09/22/2013    History of present illness R TSA   OT comments  Reviewed information with pt and he performed exercises. Feel pt is safe to d/c home.  Follow Up Recommendations  Outpatient OT;Supervision - Intermittent (to follow up with MD and therapy as needed)    Equipment Recommendations  None recommended by OT    Recommendations for Other Services      Precautions / Restrictions Precautions Precautions: Shoulder Type of Shoulder Precautions: Norris protocol Shoulder Interventions: Shoulder sling/immobilizer;Off for dressing/bathing/exercises Precaution Booklet Issued: Yes (comment) Precaution Comments: Reviewed precautions Restrictions Weight Bearing Restrictions: Yes RUE Weight Bearing: Non weight bearing       Mobility Bed Mobility Overal bed mobility: Modified Independent                Transfers Overall transfer level: Needs assistance   Transfers: Sit to/from Stand Sit to Stand: Supervision              Balance                                   ADL Overall ADL's : Needs assistance/impaired                         Toilet Transfer: Supervision/safety           Functional mobility during ADLs: Supervision/safety General ADL Comments: Reviewed some ADL information with pt.  Practiced donning/doffing sling. Performed exercises.      Vision                     Perception     Praxis      Cognition   Behavior During Therapy: WFL for tasks assessed/performed Overall Cognitive Status: Within Functional Limits for tasks assessed                       Extremity/Trunk Assessment               Exercises Shoulder Exercises Pendulum Exercise: 15 reps;Right;Standing Shoulder Flexion: AAROM;Right;10 reps;Supine Shoulder External Rotation: AAROM;Right;10 reps;Supine Elbow  Flexion: AROM;Right;10 reps;Standing Elbow Extension: AROM;Right;10 reps;Standing Wrist Flexion: AROM;Right;10 reps;Standing Wrist Extension: AROM;Right;10 reps;Standing Digit Composite Flexion: AROM;Right;10 reps;Standing Composite Extension: AROM;Right;10 reps;Standing Neck Flexion: AROM;5 reps;Standing Neck Extension: AROM;5 reps;Standing Neck Lateral Flexion - Right: AROM;5 reps;Standing Neck Lateral Flexion - Left: AROM;5 reps;Standing Other Exercises Other Exercises: lap slides Donning/doffing shirt without moving shoulder:  (reviewed) Method for sponge bathing under operated UE:  (educated as pt was unsure how to do this) Donning/doffing sling/immobilizer: Moderate assistance (no family present to practice assisting pt) Correct positioning of sling/immobilizer: Minimal assistance Pendulum exercises (written home exercise program): Supervision/safety ROM for elbow, wrist and digits of operated UE: Supervision/safety Sling wearing schedule (on at all times/off for ADL's):  (educated) Positioning of UE while sleeping: Patient able to independently direct caregiver   Shoulder Instructions Shoulder Instructions Donning/doffing shirt without moving shoulder:  (reviewed) Method for sponge bathing under operated UE:  (educated as pt was unsure how to do this) Donning/doffing sling/immobilizer: Moderate assistance (no family present to practice assisting pt) Correct positioning of sling/immobilizer: Minimal assistance Pendulum exercises (written home exercise program): Supervision/safety ROM for elbow, wrist and digits of operated UE: Supervision/safety Sling wearing schedule (on at all times/off for ADL's):  (educated) Positioning of UE  while sleeping: Patient able to independently direct caregiver     General Comments      Pertinent Vitals/ Pain       Pain 3-4/10. Ice applied.   Home Living                                          Prior  Functioning/Environment              Frequency Min 3X/week     Progress Toward Goals  OT Goals(current goals can now be found in the care plan section)  Progress towards OT goals: Progressing toward goals  Acute Rehab OT Goals Patient Stated Goal: not stated OT Goal Formulation: With patient Time For Goal Achievement: 10/05/13 Potential to Achieve Goals: Good ADL Goals Pt Will Perform Upper Body Bathing: with caregiver independent in assisting;with supervision Pt Will Perform Upper Body Dressing: with supervision;with caregiver independent in assisting Pt/caregiver will Perform Home Exercise Program: Right Upper extremity;Increased ROM;With written HEP provided Additional ADL Goal #1: pt/family independent with positioning RUE in/out of sling  Plan Discharge plan remains appropriate    Co-evaluation                 End of Session Equipment Utilized During Treatment: Other (comment) (sling)   Activity Tolerance Patient tolerated treatment well   Patient Left in bed;with call bell/phone within reach   Nurse Communication Mobility status        Time: [id number] OT Time Calculation (min): 27 min  Charges: OT General Charges $OT Visit: 1 Procedure OT Treatments $Therapeutic Exercise: 23-37 mins  Earlie RavelingLindsey L Wilmarie Sparlin  OTR/L [phone]  09/22/2013, 1:50 PM

## 2013-09-26 ENCOUNTER — Encounter (HOSPITAL_COMMUNITY): Payer: Self-pay | Admitting: Orthopedic Surgery

## 2013-09-26 NOTE — Discharge Summary (Signed)
Physician Discharge Summary   Patient ID: Jerry Turner MRN: [id number] DOB/AGE: 05/29/1960 53 y.o.  Admit date: 09/20/2013 Discharge date: 09/22/13  Admission Diagnoses:  Active Problems:   Osteoarthrosis, unspecified whether generalized or localized, shoulder region   Discharge Diagnoses:  Same   Surgeries: Procedure(s): RIGHT TOTAL SHOULDER ARTHROPLASTY on 09/20/2013   Consultants: PT/OT  Discharged Condition: Stable  Hospital Course: Jerry Turner is an 53 y.o. male who was admitted 09/20/2013 with a chief complaint of No chief complaint on file. , and found to have a diagnosis of <principal problem not specified>.  They were brought to the operating room on 09/20/2013 and underwent the above named procedures.    The patient had an uncomplicated hospital course and was stable for discharge.  Recent vital signs:  Filed Vitals:   09/22/13 0459  BP: 124/76  Pulse: 69  Temp: 99.7 F (37.6 C)  Resp: 18    Recent laboratory studies:  Results for orders placed during the hospital encounter of 09/20/13  HEMOGLOBIN AND HEMATOCRIT, BLOOD      Result Value Ref Range   Hemoglobin 12.8 (*) 13.0 - 17.0 g/dL   HCT 59.9 (*) 77.4 - 14.2 %  BASIC METABOLIC PANEL      Result Value Ref Range   Sodium 137  137 - 147 mEq/L   Potassium 3.6 (*) 3.7 - 5.3 mEq/L   Chloride 101  96 - 112 mEq/L   CO2 24  19 - 32 mEq/L   Glucose, Bld 138 (*) 70 - 99 mg/dL   BUN 13  6 - 23 mg/dL   Creatinine, Ser 3.95  0.50 - 1.35 mg/dL   Calcium 8.1 (*) 8.4 - 10.5 mg/dL   GFR calc non Af Amer 82 (*) >90 mL/min   GFR calc Af Amer >90  >90 mL/min    Discharge Medications:     Medication List         methocarbamol 500 MG tablet  Commonly known as:  ROBAXIN  Take 1 tablet (500 mg total) by mouth 3 (three) times daily as needed for muscle spasms.     oxyCODONE-acetaminophen 7.5-325 MG per tablet  Commonly known as:  PERCOCET  Take 1 tablet by mouth every 4 (four) hours as needed for pain.        Diagnostic Studies: Dg Shoulder Right  09/20/2013   CLINICAL DATA:  Postoperative for right shoulder surgery.  EXAM: RIGHT SHOULDER - 2+ VIEW  COMPARISON:  None.  FINDINGS: Right humeral prosthesis observed without fracture or complicating feature. There is sclerosis along the glenoid, with overlying lucencies likely representing gas in the adjacent soft tissues.  Subsegmental atelectasis along the right hemidiaphragm.  IMPRESSION: 1. Right shoulder hemiarthroplasty without complicating feature observed. 2. Mild atelectasis at the right lung base.   Electronically Signed   By: Herbie Baltimore M.D.   On: 09/20/2013 15:12    Disposition: 01-Home or Self Care      Discharge Instructions   Call MD / Call 911    Complete by:  As directed   If you experience chest pain or shortness of breath, CALL 911 and be transported to the hospital emergency room.  If you develope a fever above 101 F, pus (white drainage) or increased drainage or redness at the wound, or calf pain, call your surgeon's office.     Constipation Prevention    Complete by:  As directed   Drink plenty of fluids.  Prune juice may be helpful.  You may use  a stool softener, such as Colace (over the counter) 100 mg twice a day.  Use MiraLax (over the counter) for constipation as needed.     Diet - low sodium heart healthy    Complete by:  As directed      Increase activity slowly as tolerated    Complete by:  As directed      Non weight bearing    Complete by:  As directed   Laterality:  right  Extremity:  Upper           Follow-up Information   Follow up with NORRIS,STEVEN R, MD. Call in 2 weeks. [phone])    Specialty:  Orthopedic Surgery   Contact information:   [address] [place] [address] [phone]        Signed: Thea Gisthomas B Darcella Shiffman 09/26/2013, 10:05 AM

## 2013-10-04 ENCOUNTER — Encounter (HOSPITAL_COMMUNITY): Payer: Self-pay | Admitting: Orthopedic Surgery

## 2015-08-12 ENCOUNTER — Emergency Department (HOSPITAL_COMMUNITY): Payer: PRIVATE HEALTH INSURANCE

## 2015-08-12 ENCOUNTER — Encounter (HOSPITAL_COMMUNITY): Payer: Self-pay

## 2015-08-12 ENCOUNTER — Emergency Department (HOSPITAL_COMMUNITY)
Admission: EM | Admit: 2015-08-12 | Discharge: 2015-08-12 | Disposition: A | Discharge status: Home / Self Care | Payer: PRIVATE HEALTH INSURANCE | Attending: Emergency Medicine | Admitting: Emergency Medicine

## 2015-08-12 DIAGNOSIS — Z79899 Other long term (current) drug therapy: Secondary | ICD-10-CM | POA: Insufficient documentation

## 2015-08-12 DIAGNOSIS — R319 Hematuria, unspecified: Secondary | ICD-10-CM | POA: Insufficient documentation

## 2015-08-12 DIAGNOSIS — R109 Unspecified abdominal pain: Secondary | ICD-10-CM | POA: Diagnosis present

## 2015-08-12 DIAGNOSIS — R1032 Left lower quadrant pain: Secondary | ICD-10-CM

## 2015-08-12 DIAGNOSIS — Z87442 Personal history of urinary calculi: Secondary | ICD-10-CM | POA: Insufficient documentation

## 2015-08-12 HISTORY — DX: Calculus of kidney: N20.0

## 2015-08-12 LAB — URINE MICROSCOPIC-ADD ON

## 2015-08-12 LAB — URINALYSIS, ROUTINE W REFLEX MICROSCOPIC
BILIRUBIN URINE: NEGATIVE
Glucose, UA: NEGATIVE mg/dL
Ketones, ur: NEGATIVE mg/dL
LEUKOCYTES UA: NEGATIVE
NITRITE: NEGATIVE
PROTEIN: NEGATIVE mg/dL
SPECIFIC GRAVITY, URINE: 1.027 (ref 1.005–1.030)
pH: 5.5 (ref 5.0–8.0)

## 2015-08-12 MED ORDER — OXYCODONE-ACETAMINOPHEN 7.5-325 MG PO TABS: 1.0000 | ORAL_TABLET | Freq: Four times a day (QID) | ORAL | Status: DC | PRN

## 2015-08-12 NOTE — ED Notes (Signed)
Patient states he has a history of kidney stones. Patient c/o left groin since 1530. No problems urinating or blood in his urine.

## 2015-08-12 NOTE — ED Provider Notes (Signed)
CSN: [id number]     Arrival date & time 08/12/15  1617 History   First MD Initiated Contact with Patient 08/12/15 1721     Chief Complaint  Patient presents with  . Groin Pain  . Nephrolithiasis     (Consider location/radiation/quality/duration/timing/severity/associated sxs/prior Treatment) HPI   55 year old male with history of kidney stones presents with complaints of left groin pain. Patient reports yesterday he noticed some mild left flank pain. Today while at work he developed pain to his left groin which he described as a stabbing sensation, intense, 10 out of 10, lasting for several hours but has since improved to 2 out of 10.  Nothing makes the pain better or worse. Pain felt similar to prior kidney stone that he had in the past. He felt nauseous but denies vomiting. He currently feels much more comfortable. Patient denies having fever, chest pain, shortness of breath, productive cough, abdominal pain, dysuria, hematuria, penile discharge, or rash. He denies any history of hernia. He denies any recent strenuous activities or any recent injury. He has not had a kidney stone for at least 6 years.   Past Medical History  Diagnosis Date  . Kidney stones    Past Surgical History  Procedure Laterality Date  . Kidney stone surgery    . Total shoulder arthroplasty Right 09/20/2013    Procedure: RIGHT TOTAL SHOULDER ARTHROPLASTY;  Surgeon: Verlee Rossetti, MD;  Location: [facility] OR;  Service: Orthopedics;  Laterality: Right;   History reviewed. No pertinent family history. Social History  Substance Use Topics  . Smoking status: Never Smoker   . Smokeless tobacco: None  . Alcohol Use: Yes     Comment: once a week    Review of Systems  All other systems reviewed and are negative.     Allergies  Review of patient's allergies indicates no known allergies.  Home Medications   Prior to Admission medications   Medication Sig Start Date End Date Taking? Authorizing Provider  Multiple  Vitamin (MULTIVITAMIN WITH MINERALS) TABS tablet Take 1 tablet by mouth daily.   Yes Historical Provider, MD  methocarbamol (ROBAXIN) 500 MG tablet Take 1 tablet (500 mg total) by mouth 3 (three) times daily as needed for muscle spasms. Patient not taking: Reported on 08/12/2015 09/20/13   Beverely Low, MD  oxyCODONE-acetaminophen (PERCOCET) 7.5-325 MG per tablet Take 1 tablet by mouth every 4 (four) hours as needed for pain. Patient not taking: Reported on 08/12/2015 09/20/13   Beverely Low, MD   BP 142/88 mmHg  Pulse 54  Temp(Src) 97.6 F (36.4 C) (Oral)  Resp 16  Ht 6' (1.829 m)  Wt 90.719 kg  BMI 27.12 kg/m2  SpO2 100% Physical Exam  Constitutional: He appears well-developed and well-nourished. No distress.  Well-appearing Caucasian male sitting in bed in no acute discomfort.  HENT:  Head: Atraumatic.  Eyes: Conjunctivae are normal.  Neck: Neck supple.  Cardiovascular: Normal rate and regular rhythm.   Pulmonary/Chest: Effort normal and breath sounds normal.  Abdominal: Soft. Bowel sounds are normal. He exhibits no distension. There is no tenderness.  No CVA tenderness  Genitourinary:  Chaperone present during exam. No inguinal lymphadenopathy or inguinal hernia noted. No mass and sinus penis free of lesion or rash. Testicles are nontender with normal lie and normal scrotum. Perineum soft.  Neurological: He is alert.  Skin: No rash noted.  Psychiatric: He has a normal mood and affect.  Nursing note and vitals reviewed.   ED Course  Procedures (including critical  care time) Labs Review Labs Reviewed  URINALYSIS, ROUTINE W REFLEX MICROSCOPIC (NOT AT [facility]) - Abnormal; Notable for the following:    APPearance CLOUDY (*)    Hgb urine dipstick LARGE (*)    All other components within normal limits  URINE MICROSCOPIC-ADD ON - Abnormal; Notable for the following:    Squamous Epithelial / LPF 0-5 (*)    Bacteria, UA RARE (*)    All other components within normal limits     Imaging Review [facility]  08/12/2015  CLINICAL DATA:  Left groin pain.  History of kidney stones. EXAM: RENAL / URINARY TRACT ULTRASOUND COMPLETE COMPARISON:  CT 06/19/2009 FINDINGS: Right Kidney: Length: 11.5 cm. Echogenicity within normal limits. No mass or hydronephrosis visualized. Left Kidney: Length: 12.3 cm. Echogenicity within normal limits. No mass or hydronephrosis visualized. Bladder: Appears normal for degree of bladder distention. IMPRESSION: Normal renal ultrasound. Electronically Signed   By: Charlett NoseKevin  Dover M.D.   On: 08/12/2015 18:25   I have personally reviewed and evaluated these images and lab results as part of my medical decision-making.   EKG Interpretation None      MDM   Final diagnoses:  Left groin pain  Hematuria    BP 142/88 mmHg  Pulse 54  Temp(Src) 97.6 F (36.4 C) (Oral)  Resp 16  Ht 6' (1.829 m)  Wt 90.719 kg  BMI 27.12 kg/m2  SpO2 100%   5:55 PM Patient with history of kidney stones presenting with left groin pain and left back pain. He has no CVA tenderness and no signs of hernia noted on exam. Suspect a recently passed kidney stone. We'll check UA and obtain renal ultrasound. Pain medication offer patient declined at this time.  6:45 PM UA with large amounts of  hemoglobin on urine dipstick suggestive of kidney stones. No signs of UTI. Renal ultrasound is unremarkable no evidence of hydronephrosis or mass. Pt currently with minimal pain and felt comfortable going home.  We suspect pt may have passed a small kidney stone.  Pt does express some concern of residual pain and therefore i will prescribe a short course of pain medication to use as needed.  Pt to f/u with urology as needed.  Return precaution discussed.   Fayrene HelperBowie Demaree Liberto, PA-C 08/12/15 1855  Lorre NickAnthony Allen, MD 08/12/15 [phone]

## 2015-08-12 NOTE — ED Notes (Signed)
PT DISCHARGED. INSTRUCTIONS AND PRESCRIPTION GIVEN. AAOX3. PT IN NO APPARENT DISTRESS. THE OPPORTUNITY TO ASK QUESTIONS WAS PROVIDED. 

## 2015-08-12 NOTE — Discharge Instructions (Signed)
Your pain is likely due to a recent passed kidney stone.  Take pain medication as needed for pain.  Follow up with [facility] for further management as needed.  Return if your condition worsen or if you have other concerns.  Hematuria, Adult Hematuria is blood in your urine. It can be caused by a bladder infection, kidney infection, prostate infection, kidney stone, or cancer of your urinary tract. Infections can usually be treated with medicine, and a kidney stone usually will pass through your urine. If neither of these is the cause of your hematuria, further workup to find out the reason may be needed. It is very important that you tell your health care provider about any blood you see in your urine, even if the blood stops without treatment or happens without causing pain. Blood in your urine that happens and then stops and then happens again can be a symptom of a very serious condition. Also, pain is not a symptom in the initial stages of many urinary cancers. HOME CARE INSTRUCTIONS   Drink lots of fluid, 3-4 quarts a day. If you have been diagnosed with an infection, cranberry juice is especially recommended, in addition to large amounts of water.  Avoid caffeine, tea, and carbonated beverages because they tend to irritate the bladder.  Avoid alcohol because it may irritate the prostate.  Take all medicines as directed by your health care provider.  If you were prescribed an antibiotic medicine, finish it all even if you start to feel better.  If you have been diagnosed with a kidney stone, follow your health care provider's instructions regarding straining your urine to catch the stone.  Empty your bladder often. Avoid holding urine for long periods of time.  After a bowel movement, women should cleanse front to back. Use each tissue only once.  Empty your bladder before and after sexual intercourse if you are a male. SEEK MEDICAL CARE IF:  You develop back pain.  You have  a fever.  You have a feeling of sickness in your stomach (nausea) or vomiting.  Your symptoms are not better in 3 days. Return sooner if you are getting worse. SEEK IMMEDIATE MEDICAL CARE IF:   You develop severe vomiting and are unable to keep the medicine down.  You develop severe back or abdominal pain despite taking your medicines.  You begin passing a large amount of blood or clots in your urine.  You feel extremely weak or faint, or you pass out. MAKE SURE YOU:   Understand these instructions.  Will watch your condition.  Will get help right away if you are not doing well or get worse.   This information is not intended to replace advice given to you by your health care provider. Make sure you discuss any questions you have with your health care provider.   Document Released: 04/18/2005 Document Revised: 05/09/2014 Document Reviewed: 12/17/2012 Elsevier Interactive Patient Education [employer]2016 Elsevier Inc.

## 2016-01-07 ENCOUNTER — Emergency Department (HOSPITAL_COMMUNITY): Payer: PRIVATE HEALTH INSURANCE | Admitting: Anesthesiology

## 2016-01-07 ENCOUNTER — Emergency Department (HOSPITAL_COMMUNITY): Payer: PRIVATE HEALTH INSURANCE

## 2016-01-07 ENCOUNTER — Encounter (HOSPITAL_COMMUNITY): Payer: Self-pay

## 2016-01-07 ENCOUNTER — Encounter (HOSPITAL_COMMUNITY)
Admission: EM | Disposition: A | Discharge status: Home / Self Care | Payer: Self-pay | Source: Home / Self Care | Attending: Emergency Medicine

## 2016-01-07 ENCOUNTER — Observation Stay (HOSPITAL_COMMUNITY)
Admission: EM | Admit: 2016-01-07 | Discharge: 2016-01-08 | Disposition: A | Discharge status: Home / Self Care | Payer: PRIVATE HEALTH INSURANCE | Attending: Urology | Admitting: Urology

## 2016-01-07 DIAGNOSIS — Z96611 Presence of right artificial shoulder joint: Secondary | ICD-10-CM | POA: Insufficient documentation

## 2016-01-07 DIAGNOSIS — R0602 Shortness of breath: Secondary | ICD-10-CM

## 2016-01-07 DIAGNOSIS — J9601 Acute respiratory failure with hypoxia: Secondary | ICD-10-CM | POA: Diagnosis not present

## 2016-01-07 DIAGNOSIS — N132 Hydronephrosis with renal and ureteral calculous obstruction: Secondary | ICD-10-CM | POA: Diagnosis not present

## 2016-01-07 DIAGNOSIS — J811 Chronic pulmonary edema: Secondary | ICD-10-CM | POA: Diagnosis not present

## 2016-01-07 DIAGNOSIS — Z950 Presence of cardiac pacemaker: Secondary | ICD-10-CM | POA: Diagnosis not present

## 2016-01-07 DIAGNOSIS — N201 Calculus of ureter: Secondary | ICD-10-CM

## 2016-01-07 DIAGNOSIS — R109 Unspecified abdominal pain: Secondary | ICD-10-CM | POA: Diagnosis present

## 2016-01-07 HISTORY — PX: HOLMIUM LASER APPLICATION: SHX5852

## 2016-01-07 HISTORY — PX: CYSTOSCOPY W/ URETERAL STENT PLACEMENT: SHX1429

## 2016-01-07 LAB — URINE MICROSCOPIC-ADD ON

## 2016-01-07 LAB — URINALYSIS, ROUTINE W REFLEX MICROSCOPIC
BILIRUBIN URINE: NEGATIVE
Glucose, UA: NEGATIVE mg/dL
KETONES UR: 15 mg/dL — AB
Leukocytes, UA: NEGATIVE
NITRITE: NEGATIVE
Protein, ur: NEGATIVE mg/dL
Specific Gravity, Urine: 1.027 (ref 1.005–1.030)
pH: 5 (ref 5.0–8.0)

## 2016-01-07 LAB — I-STAT CHEM 8, ED
BUN: 13 mg/dL (ref 6–20)
CALCIUM ION: 1.11 mmol/L — AB (ref 1.15–1.40)
Chloride: 103 mmol/L (ref 101–111)
Creatinine, Ser: 1.2 mg/dL (ref 0.61–1.24)
Glucose, Bld: 141 mg/dL — ABNORMAL HIGH (ref 65–99)
HEMATOCRIT: 44 % (ref 39.0–52.0)
HEMOGLOBIN: 15 g/dL (ref 13.0–17.0)
Potassium: 4.6 mmol/L (ref 3.5–5.1)
SODIUM: 141 mmol/L (ref 135–145)
TCO2: 26 mmol/L (ref 0–100)

## 2016-01-07 SURGERY — CYSTOSCOPY, WITH RETROGRADE PYELOGRAM AND URETERAL STENT INSERTION
Anesthesia: General | Laterality: Left

## 2016-01-07 MED ORDER — LACTATED RINGERS IV SOLN
INTRAVENOUS | Status: DC | PRN
  Administered 2016-01-07: 22:00:00 via INTRAVENOUS

## 2016-01-07 MED ORDER — HYDROMORPHONE HCL 1 MG/ML IJ SOLN
1.0000 mg | Freq: Once | INTRAMUSCULAR | Status: AC
  Administered 2016-01-07: 1 mg via INTRAVENOUS
  Filled 2016-01-07: qty 1

## 2016-01-07 MED ORDER — PROPOFOL 10 MG/ML IV BOLUS
INTRAVENOUS | Status: AC
  Filled 2016-01-07: qty 20

## 2016-01-07 MED ORDER — OXYCODONE-ACETAMINOPHEN 5-325 MG PO TABS: 1.0000 | ORAL_TABLET | Freq: Once | ORAL | Status: DC

## 2016-01-07 MED ORDER — ONDANSETRON HCL 4 MG/2ML IJ SOLN
4.0000 mg | Freq: Once | INTRAMUSCULAR | Status: AC
  Administered 2016-01-07: 4 mg via INTRAVENOUS
  Filled 2016-01-07: qty 2

## 2016-01-07 MED ORDER — ONDANSETRON HCL 4 MG/2ML IJ SOLN
INTRAMUSCULAR | Status: DC | PRN
  Administered 2016-01-07: 4 mg via INTRAVENOUS

## 2016-01-07 MED ORDER — CEFAZOLIN SODIUM-DEXTROSE 2-4 GM/100ML-% IV SOLN
2.0000 g | INTRAVENOUS | Status: DC
  Filled 2016-01-07: qty 100

## 2016-01-07 MED ORDER — LIDOCAINE 2% (20 MG/ML) 5 ML SYRINGE
INTRAMUSCULAR | Status: DC | PRN
  Administered 2016-01-07: 80 mg via INTRAVENOUS

## 2016-01-07 MED ORDER — HYDROCODONE-ACETAMINOPHEN 7.5-325 MG PO TABS: 1.0000 | ORAL_TABLET | Freq: Once | ORAL | Status: DC | PRN

## 2016-01-07 MED ORDER — LIDOCAINE HCL 2 % EX GEL
CUTANEOUS | Status: DC | PRN
  Administered 2016-01-07: 1 via URETHRAL

## 2016-01-07 MED ORDER — HYDROMORPHONE HCL 1 MG/ML IJ SOLN
1.0000 mg | Freq: Once | INTRAMUSCULAR | Status: AC | PRN
  Administered 2016-01-07: 1 mg via INTRAVENOUS
  Filled 2016-01-07 (×2): qty 1

## 2016-01-07 MED ORDER — CEFAZOLIN (ANCEF) 1 G IV SOLR: 2.0000 g | INTRAVENOUS | Status: DC

## 2016-01-07 MED ORDER — OXYCODONE-ACETAMINOPHEN 5-325 MG PO TABS
1.0000 | ORAL_TABLET | Freq: Four times a day (QID) | ORAL | 0 refills | Status: DC | PRN
Start: 2016-01-07 — End: 2016-01-08

## 2016-01-07 MED ORDER — IOHEXOL 300 MG/ML  SOLN
INTRAMUSCULAR | Status: DC | PRN
  Administered 2016-01-07: 10 mL via URETHRAL

## 2016-01-07 MED ORDER — LIDOCAINE 2% (20 MG/ML) 5 ML SYRINGE
INTRAMUSCULAR | Status: AC
  Filled 2016-01-07: qty 5

## 2016-01-07 MED ORDER — KETOROLAC TROMETHAMINE 30 MG/ML IJ SOLN
30.0000 mg | Freq: Once | INTRAMUSCULAR | Status: AC
  Administered 2016-01-07: 30 mg via INTRAVENOUS
  Filled 2016-01-07 (×2): qty 1

## 2016-01-07 MED ORDER — FENTANYL CITRATE (PF) 100 MCG/2ML IJ SOLN
INTRAMUSCULAR | Status: AC
  Filled 2016-01-07: qty 2

## 2016-01-07 MED ORDER — PROMETHAZINE HCL 25 MG/ML IJ SOLN: 6.2500 mg | INTRAMUSCULAR | Status: DC | PRN

## 2016-01-07 MED ORDER — ONDANSETRON HCL 4 MG/2ML IJ SOLN: 4.0000 mg | Freq: Once | INTRAMUSCULAR | Status: DC

## 2016-01-07 MED ORDER — ONDANSETRON HCL 4 MG/2ML IJ SOLN
INTRAMUSCULAR | Status: AC
  Filled 2016-01-07: qty 2

## 2016-01-07 MED ORDER — PROPOFOL 10 MG/ML IV BOLUS
INTRAVENOUS | Status: DC | PRN
  Administered 2016-01-07: 160 mg via INTRAVENOUS

## 2016-01-07 MED ORDER — TAMSULOSIN HCL 0.4 MG PO CAPS: 0.4000 mg | ORAL_CAPSULE | Freq: Every day | ORAL | 0 refills | Status: DC

## 2016-01-07 MED ORDER — CEFAZOLIN SODIUM-DEXTROSE 2-4 GM/100ML-% IV SOLN
INTRAVENOUS | Status: AC
  Filled 2016-01-07: qty 100

## 2016-01-07 MED ORDER — FENTANYL CITRATE (PF) 100 MCG/2ML IJ SOLN: 25.0000 ug | INTRAMUSCULAR | Status: DC | PRN

## 2016-01-07 MED ORDER — LIDOCAINE HCL 2 % EX GEL
CUTANEOUS | Status: AC
  Filled 2016-01-07: qty 5

## 2016-01-07 MED ORDER — SODIUM CHLORIDE 0.9 % IV BOLUS (SEPSIS)
500.0000 mL | Freq: Once | INTRAVENOUS | Status: AC
  Administered 2016-01-07: 500 mL via INTRAVENOUS

## 2016-01-07 MED ORDER — CEPHALEXIN 500 MG PO CAPS: 500.0000 mg | ORAL_CAPSULE | Freq: Two times a day (BID) | ORAL | 0 refills | Status: DC

## 2016-01-07 MED ORDER — MORPHINE SULFATE (PF) 2 MG/ML IV SOLN
2.0000 mg | Freq: Once | INTRAVENOUS | Status: DC | PRN
  Filled 2016-01-07: qty 1

## 2016-01-07 MED ORDER — SODIUM CHLORIDE 0.9 % IR SOLN
Status: DC | PRN
  Administered 2016-01-07: 5000 mL

## 2016-01-07 MED ORDER — FUROSEMIDE 10 MG/ML IJ SOLN
INTRAMUSCULAR | Status: DC | PRN
  Administered 2016-01-07: 20 mg via INTRAMUSCULAR

## 2016-01-07 MED ORDER — SODIUM CHLORIDE 0.9 % IJ SOLN
INTRAMUSCULAR | Status: AC
  Filled 2016-01-07: qty 10

## 2016-01-07 MED ORDER — MIDAZOLAM HCL 2 MG/2ML IJ SOLN
INTRAMUSCULAR | Status: DC | PRN
  Administered 2016-01-07: 1 mg via INTRAVENOUS

## 2016-01-07 MED ORDER — MIDAZOLAM HCL 2 MG/2ML IJ SOLN
INTRAMUSCULAR | Status: AC
  Filled 2016-01-07: qty 2

## 2016-01-07 MED ORDER — CEFAZOLIN SODIUM-DEXTROSE 2-3 GM-% IV SOLR
INTRAVENOUS | Status: DC | PRN
  Administered 2016-01-07: 2 g via INTRAVENOUS

## 2016-01-07 MED ORDER — ONDANSETRON HCL 4 MG/2ML IJ SOLN
4.0000 mg | Freq: Once | INTRAMUSCULAR | Status: AC
  Administered 2016-01-07: 4 mg via INTRAVENOUS
  Filled 2016-01-07 (×2): qty 2

## 2016-01-07 MED ORDER — FENTANYL CITRATE (PF) 100 MCG/2ML IJ SOLN
INTRAMUSCULAR | Status: DC | PRN
  Administered 2016-01-07 (×2): 50 ug via INTRAVENOUS

## 2016-01-07 MED ORDER — MORPHINE SULFATE (PF) 10 MG/ML IV SOLN
INTRAVENOUS | Status: AC
  Administered 2016-01-07: 2 mg
  Filled 2016-01-07: qty 1

## 2016-01-07 SURGICAL SUPPLY — 15 items
BAG URO CATCHER STRL LF (MISCELLANEOUS) ×2 IMPLANT
BASKET ZERO TIP NITINOL 2.4FR (BASKET) ×1 IMPLANT
BSKT STON RTRVL ZERO TP 2.4FR (BASKET) ×1
CATH INTERMIT  6FR 70CM (CATHETERS) ×1 IMPLANT
CLOTH BEACON ORANGE TIMEOUT ST (SAFETY) ×2 IMPLANT
FIBER LASER TRAC TIP (UROLOGICAL SUPPLIES) ×1 IMPLANT
GLOVE BIOGEL M STRL SZ7.5 (GLOVE) ×4 IMPLANT
GOWN STRL REUS W/TWL LRG LVL3 (GOWN DISPOSABLE) ×2 IMPLANT
GOWN STRL REUS W/TWL XL LVL3 (GOWN DISPOSABLE) ×2 IMPLANT
GUIDEWIRE ANG ZIPWIRE 038X150 (WIRE) IMPLANT
GUIDEWIRE STR DUAL SENSOR (WIRE) ×2 IMPLANT
MANIFOLD NEPTUNE II (INSTRUMENTS) ×2 IMPLANT
PACK CYSTO (CUSTOM PROCEDURE TRAY) ×2 IMPLANT
STENT CONTOUR 6FRX28X.038 (STENTS) ×1 IMPLANT
TUBING CONNECTING 10 (TUBING) ×2 IMPLANT

## 2016-01-07 NOTE — ED Provider Notes (Signed)
[facility]-EMERGENCY DEPT Provider Note   CSN: [id number] Arrival date & time: 01/07/16  1112     History   Chief Complaint Chief Complaint  Patient presents with  . Nephrolithiasis    HPI Jerry Turner is a 55 y.o. male.  Patient with history of kidney stones, previous urological procedure to remove stone 1 -- presents with complaint of acute onset of left flank pain consistent with previous stones starting at 9:30am. Patient with severe pain, diaphoresis, nausea and vomiting. Patient was in his normal state of health prior to this time. No fevers, chest pain, shortness of breath. Patient had nonbloody diarrhea yesterday. No dysuria or penile discharge. No lightheadedness or syncope. The onset of this condition was acute. The course is constant. Aggravating factors: none. Alleviating factors: none.        Past Medical History:  Diagnosis Date  . Kidney stones     Patient Active Problem List   Diagnosis Date Noted  . Osteoarthrosis, unspecified whether generalized or localized, shoulder region 09/20/2013    Past Surgical History:  Procedure Laterality Date  . KIDNEY STONE SURGERY    . 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;  Service: Orthopedics;  Laterality: Right;       Home Medications    Prior to Admission medications   Medication Sig Start Date End Date Taking? Authorizing Provider  Multiple Vitamin (MULTIVITAMIN WITH MINERALS) TABS tablet Take 1 tablet by mouth daily.   Yes Historical Provider, MD  methocarbamol (ROBAXIN) 500 MG tablet Take 1 tablet (500 mg total) by mouth 3 (three) times daily as needed for muscle spasms. Patient not taking: Reported on 08/12/2015 09/20/13   Beverely LowSteve Norris, MD  oxyCODONE-acetaminophen (PERCOCET) 7.5-325 MG tablet Take 1 tablet by mouth every 6 (six) hours as needed for severe pain. Patient not taking: Reported on 01/07/2016 08/12/15    Fayrene HelperBowie Tran, PA-C    Family History No family history on file.  Social History Social History  Substance Use Topics  . Smoking status: Never Smoker  . Smokeless tobacco: Never Used  . Alcohol use Yes     Comment: once a week     Allergies   Review of patient's allergies indicates no known allergies.   Review of Systems Review of Systems  Constitutional: Negative for fever.  HENT: Negative for rhinorrhea and sore throat.   Eyes: Negative for redness.  Respiratory: Negative for cough.   Cardiovascular: Negative for chest pain.  Gastrointestinal: Positive for nausea and vomiting. Negative for abdominal pain and diarrhea.  Genitourinary: Positive for flank pain. Negative for difficulty urinating, dysuria and hematuria.  Musculoskeletal: Negative for myalgias.  Skin: Negative for rash.  Neurological: Negative for headaches.     Physical Exam Updated Vital Signs BP 165/100   Pulse (!) 52   Resp 26   Ht 6' (1.829 m)   Wt 90.7 kg   SpO2 94%   BMI 27.12 kg/m   Physical Exam  Constitutional: He appears well-developed and well-nourished. He appears distressed.  HENT:  Head: Normocephalic and atraumatic.  Eyes: Conjunctivae are normal. Right eye exhibits no discharge. Left eye exhibits no discharge.  Neck: Normal range of motion. Neck supple.  Cardiovascular: Normal rate, regular rhythm and normal heart sounds.   Pulmonary/Chest: Effort normal and breath sounds normal.  Abdominal: Soft. He exhibits no mass. There is no tenderness. There is no guarding.  Neurological: He is alert.  Skin:  Skin is warm and dry.  Psychiatric: He has a normal mood and affect.  Nursing note and vitals reviewed.    ED Treatments / Results  Labs (all labs ordered are listed, but only abnormal results are displayed) Labs Reviewed  URINALYSIS, ROUTINE W REFLEX MICROSCOPIC (NOT AT [facility]) - Abnormal; Notable for the following:       Result Value   APPearance CLOUDY (*)    Hgb urine dipstick  LARGE (*)    Ketones, ur 15 (*)    All other components within normal limits  URINE MICROSCOPIC-ADD ON - Abnormal; Notable for the following:    Squamous Epithelial / LPF 0-5 (*)    Bacteria, UA FEW (*)    All other components within normal limits  I-STAT CHEM 8, ED - Abnormal; Notable for the following:    Glucose, Bld 141 (*)    Calcium, Ion 1.11 (*)    All other components within normal limits    Radiology No results found.  Procedures Procedures (including critical care time)  Medications Ordered in ED Medications  ketorolac (TORADOL) 30 MG/ML injection 30 mg (30 mg Intravenous Given 01/07/16 1134)  ondansetron (ZOFRAN) injection 4 mg (4 mg Intravenous Given 01/07/16 1132)  HYDROmorphone (DILAUDID) injection 1 mg (1 mg Intravenous Given 01/07/16 1135)     Initial Impression / Assessment and Plan / ED Course  I have reviewed the triage vital signs and the nursing notes.  Pertinent labs & imaging results that were available during my care of the patient were reviewed by me and considered in my medical decision making (see chart for details).  Clinical Course   Patient seen and examined. Work-up initiated. Medications ordered.   Vital signs reviewed and are as follows: BP 165/100   Pulse (!) 52   Resp 26   Ht 6' (1.829 m)   Wt 90.7 kg   SpO2 94%   BMI 27.12 kg/m   12:07 PM Patient is much improved. Awaiting labs.   4:48 PM Patient has had recurrence of pain x 3, total 3mg  dialudid and 30mg  IV toradol at this point. Will need CT renal.   Handoff to Dr. Bebe Shaggy at shift change who will recheck and dispo based on clinical picture.  Final Clinical Impressions(s) / ED Diagnoses   Final diagnoses:  None    New Prescriptions New Prescriptions   No medications on file     Renne Crigler, PA-C 01/07/16 1651    Vanetta Mulders, MD 01/13/16 1546

## 2016-01-07 NOTE — Progress Notes (Addendum)
Pharmacy: Antibiotic dosing request - Ancef for surgical prophylaxis  Plan stent placement for renal stone, Ancef 2gm x1 ordered Post-op antibiotics per Urologist  Thank you,   Otho BellowsGreen, Shanera Meske L PharmD Pager [phone] 01/07/2016, 9:49 PM

## 2016-01-07 NOTE — ED Notes (Signed)
Pt arrives to the ER via [facility]

## 2016-01-07 NOTE — ED Notes (Signed)
PA at the bedside.

## 2016-01-07 NOTE — Anesthesia Preprocedure Evaluation (Addendum)
Anesthesia Evaluation  Patient identified by MRN, date of birth, ID band Patient awake    Reviewed: Allergy & Precautions, H&P , NPO status , Patient's Chart, lab work & pertinent test results  History of Anesthesia Complications Negative for: history of anesthetic complications  Airway Mallampati: II  TM Distance: >3 FB Neck ROM: full    Dental no notable dental hx. (+) Dental Advisory Given, Chipped, Caps   Pulmonary neg pulmonary ROS,    Pulmonary exam normal breath sounds clear to auscultation       Cardiovascular negative cardio ROS Normal cardiovascular exam+ pacemaker  Rhythm:regular Rate:Normal     Neuro/Psych negative neurological ROS     GI/Hepatic negative GI ROS, Neg liver ROS,   Endo/Other  negative endocrine ROS  Renal/GU negative Renal ROS     Musculoskeletal   Abdominal   Peds  Hematology negative hematology ROS (+)   Anesthesia Other Findings   Reproductive/Obstetrics negative OB ROS                           Anesthesia Physical Anesthesia Plan  ASA: II  Anesthesia Plan: General   Post-op Pain Management:    Induction: Intravenous and Rapid sequence  Airway Management Planned: Oral ETT  Additional Equipment:   Intra-op Plan:   Post-operative Plan: Extubation in OR  Informed Consent: I have reviewed the patients History and Physical, chart, labs and discussed the procedure including the risks, benefits and alternatives for the proposed anesthesia with the patient or authorized representative who has indicated his/her understanding and acceptance.   Dental Advisory Given  Plan Discussed with: Anesthesiologist, CRNA and Surgeon  Anesthesia Plan Comments: (Has been nauseated and vomiting today)       Anesthesia Quick Evaluation

## 2016-01-07 NOTE — ED Triage Notes (Signed)
Pt writhing in pain, diaphoretic, hx of kidney stones, pain started this am at 0930- left flank-- actively vomiting

## 2016-01-07 NOTE — Anesthesia Procedure Notes (Signed)
Procedure Name: Intubation Date/Time: 01/07/2016 10:25 PM Performed by: Minerva EndsMIRARCHI, Levette Paulick M Pre-anesthesia Checklist: Patient identified, Emergency Drugs available, Suction available and Patient being monitored Patient Re-evaluated:Patient Re-evaluated prior to inductionOxygen Delivery Method: Circle System Utilized Preoxygenation: Pre-oxygenation with 100% oxygen Intubation Type: IV induction Ventilation: Mask ventilation without difficulty Laryngoscope Size: Miller and 2 Tube type: Oral Tube size: 7.5 mm Number of attempts: 1 Airway Equipment and Method: Stylet Placement Confirmation: ETT inserted through vocal cords under direct vision,  positive ETCO2 and breath sounds checked- equal and bilateral Secured at: 22 cm Tube secured with: Tape Dental Injury: Teeth and Oropharynx as per pre-operative assessment  Comments: Smooth IV induction Judd -- intubation AM CRNA atraumatic--- teeth and mouth as preop--- chipped teeth in front--- bilat BS Gentry RochJudd

## 2016-01-07 NOTE — ED Notes (Signed)
Waiting on urine sample. Given water

## 2016-01-07 NOTE — Discharge Instructions (Signed)
Ureteral Stent Implantation, Care After Refer to this sheet in the next few weeks. These instructions provide you with information on caring for yourself after your procedure. Your health care provider may also give you more specific instructions. Your treatment has been planned according to current medical practices, but problems sometimes occur. Call your health care provider if you have any problems or questions after your procedure.  REMOVE THE STENT: remove the stent by pulling the string with slow steady pressure Monday morning, 01/11/2016.   WHAT TO EXPECT AFTER THE PROCEDURE You should be back to normal activity within 48 hours after the procedure. Nausea and vomiting may occur and are commonly the result of anesthesia. It is common to experience sharp pain in the back or lower abdomen and penis with voiding. This is caused by movement of the ends of the stent with the act of urinating.It usually goes away within minutes after you have stopped urinating. HOME CARE INSTRUCTIONS Make sure to drink plenty of fluids. You may have small amounts of bleeding, causing your urine to be red. This is normal. Certain movements may trigger pain or a feeling that you need to urinate. You may be given medicines to prevent infection or bladder spasms. Be sure to take all medicines as directed. Only take over-the-counter or prescription medicines for pain, discomfort, or fever as directed by your health care provider. Do not take aspirin, as this can make bleeding worse.  Be sure to keep all follow-up appointments so your health care provider can check that you are healing properly.  SEEK MEDICAL CARE IF:  You experience increasing pain.  Your pain medicine is not working. SEEK IMMEDIATE MEDICAL CARE IF:  Your urine is dark red or has blood clots.  You are leaking urine (incontinent).  You have a fever, chills, feeling sick to your stomach (nausea), or vomiting.  Your pain is not relieved by pain  medicine.  The end of the stent comes out of the urethra.  You are unable to urinate.   This information is not intended to replace advice given to you by your health care provider. Make sure you discuss any questions you have with your health care provider.   Document Released: 12/19/2012 Document Revised: 04/23/2013 Document Reviewed: 10/31/2014 Elsevier Interactive Patient Education [employer] Please read and follow all provided instructions.  Tests performed today include:  Urine test that showed blood in your urine and no infection  CT scan  Blood test that showed normal kidney function  Vital signs. See below for your results today.   Home care instructions:  Follow any educational materials contained in this packet.  Please double your fluid intake for the next several days. Strain your urine and save any stones that may pass.   BE VERY CAREFUL not to take multiple medicines containing Tylenol (also called acetaminophen). Doing so can lead to an overdose which can damage your liver and cause liver failure and possibly death.   Follow-up instructions: Please follow-up with your urologist or the urologist referral in the next 1 week for further evaluation of your symptoms.  Return instructions:  If you need to return to the Emergency Department, go to [facility] and not [facility]. The urologists are located at [facility] and can better care for you at this location.   Please return to the Emergency Department if you experience worsening symptoms.  Please return if you develop fever or uncontrolled pain or vomiting.  Please return if  you have any other emergent concerns.  Additional Information:  Your vital signs today were: BP 128/79    Pulse (!) 59    Resp 16    Ht 6' (1.829 m)    Wt 90.7 kg    SpO2 93%    BMI 27.12 kg/m  If your blood pressure (BP) was elevated above 135/85 this visit, please have this repeated by your doctor within  one month. --------------

## 2016-01-07 NOTE — H&P (Signed)
H&P  Chief Complaint: left proximal ureteral stone   History of Present Illness: Patient developed acute onset a left flank pain today. CT scan revealed a 5 x 7 mm left proximal stone and hydronephrosis. Patient continues to have significant discomfort. When he arrived [place] he had intense pain and nausea and was given more dilaudid. Patient denies fevers, chest pain, shortness of breath. No dysuria or penile discharge. No lightheadedness or syncope. The onset of this condition was acute. The course is constant. Aggravating factors: none. Alleviating factors: NSAIDs, narcotics.   Patient reports a prior history of kidney stones requiring what sounds like ureteroscopy and stent. A passed a stone on the left in 2007 and had a procedure on the right in 2011 but they were smaller than this one.  Past Medical History:  Diagnosis Date  . Kidney stones    Past Surgical History:  Procedure Laterality Date  . KIDNEY STONE SURGERY    . TOTAL SHOULDER ARTHROPLASTY Right 09/20/2013   Procedure: RIGHT TOTAL SHOULDER ARTHROPLASTY;  Surgeon: Verlee Rossetti, MD;  Location: [facility] OR;  Service: Orthopedics;  Laterality: Right;    Home Medications:   (Not in a hospital admission) Allergies: No Known Allergies  No family history on file. Social History:  reports that he has never smoked. He has never used smokeless tobacco. He reports that he drinks alcohol. He reports that he does not use drugs.  ROS: A complete review of systems was performed.  All systems are negative except for pertinent findings as noted. Review of Systems  All other systems reviewed and are negative.    Physical Exam:  Vital signs in last 24 hours: Temp:  [98.7 F (37.1 C)] 98.7 F (37.1 C) (09/07 1953) Pulse Rate:  [45-70] 60 (09/07 2100) Resp:  [16-26] 18 (09/07 1953) BP: (128-165)/(79-116) 153/96 (09/07 2100) SpO2:  [93 %-100 %] 97 % (09/07 2100) Weight:  [90.7 kg (200 lb)] 90.7 kg (200 lb) (09/07 1137) General:   Alert and oriented, No acute distress HEENT: Normocephalic, atraumatic Lungs: Regular rate and effort Abdomen: Soft, nontender, nondistended, no abdominal masses Back: No CVA tenderness Extremities: No edema Neurologic: Grossly intact  Laboratory Data:  Results for orders placed or performed during the hospital encounter of 01/07/16 (from the past 24 hour(s))  I-stat chem 8, ed     Status: Abnormal   Collection Time: 01/07/16 12:18 PM  Result Value Ref Range   Sodium 141 135 - 145 mmol/L   Potassium 4.6 3.5 - 5.1 mmol/L   Chloride 103 101 - 111 mmol/L   BUN 13 6 - 20 mg/dL   Creatinine, Ser 1.61 0.61 - 1.24 mg/dL   Glucose, Bld 096 (H) 65 - 99 mg/dL   Calcium, Ion 0.45 (L) 1.15 - 1.40 mmol/L   TCO2 26 0 - 100 mmol/L   Hemoglobin 15.0 13.0 - 17.0 g/dL   HCT 40.9 81.1 - 91.4 %  Urinalysis, Routine w reflex microscopic (not at [facility])     Status: Abnormal   Collection Time: 01/07/16  3:22 PM  Result Value Ref Range   Color, Urine YELLOW YELLOW   APPearance CLOUDY (A) CLEAR   Specific Gravity, Urine 1.027 1.005 - 1.030   pH 5.0 5.0 - 8.0   Glucose, UA NEGATIVE NEGATIVE mg/dL   Hgb urine dipstick LARGE (A) NEGATIVE   Bilirubin Urine NEGATIVE NEGATIVE   Ketones, ur 15 (A) NEGATIVE mg/dL   Protein, ur NEGATIVE NEGATIVE mg/dL   Nitrite NEGATIVE NEGATIVE  Leukocytes, UA NEGATIVE NEGATIVE  Urine microscopic-add on     Status: Abnormal   Collection Time: 01/07/16  3:22 PM  Result Value Ref Range   Squamous Epithelial / LPF 0-5 (A) NONE SEEN   WBC, UA 0-5 0 - 5 WBC/hpf   RBC / HPF TOO NUMEROUS TO COUNT 0 - 5 RBC/hpf   Bacteria, UA FEW (A) NONE SEEN   Urine-Other AMORPHOUS URATES/PHOSPHATES    No results found for this or any previous visit (from the past 240 hour(s)). Creatinine:  Recent Labs  01/07/16 1218  CREATININE 1.20    Impression/Assessment/plan: Left proximal ureteral stone - I went over the 2007, 2011 and today's CT images with the patient and we discussed the  nature, potential benefits, risks and alternatives to cystoscopy, left ureteral stent placement, including side effects of the proposed treatment, the likelihood of the patient achieving the goals of the procedure, and any potential problems that might occur during the procedure or recuperation. We discussed alternatives such as continued stone passage. We discussed rationale for staged procedure (proximal location, size and few bacteria on UA). All questions answered. Patient elects to proceed with ureteral stent. We discussed long-term management of stone with ESWL or URS and he elects to proceed with staged URS.    Aynslee Mulhall 01/07/2016, 9:46 PM

## 2016-01-07 NOTE — Op Note (Signed)
Preoperative diagnosis: Left ureteral stone Postoperative diagnosis: Left ureteral stone  Procedure: Cystoscopy, left retrograde pyelogram, left ureteroscopy, holmium laser lithotripsy, stone basket extraction and left ureteral stent placement  Surgeon: Mena GoesEskridge  Anesthesia: Judd  Type of anesthesia: Gen.  Indication for procedure: 55 year old with symptomatic left ureteral stone. We planned the left ureteral stent given the proximal location with a staged ureteroscopy, holmium laser lithotripsy.  Findings: On exam under anesthesia the penis was circumcised and without mass or lesion. The testicles were descended bilaterally and palpably normal. On digital rectal exam the prostate was about 30 g and benign. The prostate was smooth without hard area or nodule. All landmarks were preserved.  On cystoscopy the urethra was unremarkable, the prostatic urethra was unremarkable, the bladder was unremarkable. There were no stones or foreign bodies in the bladder. The ureteral orifices were in their normal orthotopic position.  Left retrograde pyelogram-this outlined a single ureter single collecting system unit with a mobile filling defect at the ureterovesical junction consistent with the stone and mild proximal hydroureteronephrosis.  Left ureteroscopy-stone was noted at the left UVJ.  Description of procedure: After consent was obtained patient brought to the operating room. After adequate anesthesia he was placed in lithotomy position and prepped and draped in the usual sterile fashion. An exam under anesthesia was performed. The cystoscope was passed per urethra and the bladder inspected. The left ureteral orifice was cannulated with a 6 [place] open-ended catheter and retrograde injection of contrast was performed. Given the distal location of the stone and the patient's lack of any infection signs such as dysuria fever or tachycardia I thought it was best to perform the ureteroscopy to save the  patient the need for a staged procedure. A sensor wire was advanced and coiled in the upper pole collecting system. The bladder was drained and the scope removed. A needlepoint ureteroscope was advanced to the stone and it was fragmented 0.2 and 50. Much of it was dusted and there were 2 main pieces into much smaller pieces. These were sequentially withdrawn with a 0 tip basket. The pieces were collected. Final inspection of the ureter well up over the iliacs noted to be normal without other stone fragment or injury. The wire was visualized in the lumen as far as I could see. The ureteroscope was removed and the wire was backloaded on the cystoscope and a 6 x 28 senna meters stent was advanced. The wire was removed with a good coil seen in the upper pole collecting system and a good coil in the bladder. The bladder was drained and the scope removed. A left a string on the stent. Lidocaine jelly was instilled per urethra. He was awakened and developed low sats c/w negative pressure pulmonary edema. He was taken to recovery room in stable condition.  Complications: None  Blood loss: Minimal  Specimens: Stone fragments given to patient  Drains: 6 x 28 cm left ureteral stent with string  Disposition: Patient stable to PACU. Will continue to monitor breathing. See anesthesia notes.

## 2016-01-07 NOTE — ED Notes (Signed)
Pt being taken to [facility] at this time

## 2016-01-07 NOTE — ED Provider Notes (Signed)
Pt with continued pain He has large 7mm stone on CT imaging D/w dr Mena Goeseskridge He [facility] and he will take to OR for stent D/w dr pickering in the ER at [facility]    Jerry Vilardi, MD 01/07/16 [id number]

## 2016-01-07 NOTE — Transfer of Care (Signed)
Immediate Anesthesia Transfer of Care Note  Patient: Jerry Turner  Procedure(s) Performed: Procedure(s): CYSTOSCOPY WITH RETROGRADE PYELOGRAM/URETERAL STENT PLACEMENT/STONE REMOVAL WITH BASKET (Left) HOLMIUM LASER APPLICATION (Left)  Patient Location: PACU  Anesthesia Type:General  Level of Consciousness: awake and alert   Airway & Oxygen Therapy: Patient Spontanous Breathing and Patient connected to face mask oxygen  Post-op Assessment: Report given to RN and Post -op Vital signs reviewed and stable  Post vital signs: Reviewed and stable  Last Vitals:  Vitals:   01/07/16 2100 01/07/16 2348  BP: 153/96 136/83  Pulse: 60 (!) 101  Resp:  16  Temp:  37.1 C    Last Pain:  Vitals:   01/07/16 2135  TempSrc:   PainSc: 2          Complications: possible neg pressure pulmonary edema

## 2016-01-07 NOTE — ED Provider Notes (Signed)
  Physical Exam  BP 149/93   Pulse (!) 55   Temp 98.7 F (37.1 C)   Resp 18   Ht 6' (1.829 m)   Wt 200 lb (90.7 kg)   SpO2 96%   BMI 27.12 kg/m   Physical Exam  ED Course  Procedures  [facility]. Ureteral stones to see urology. Does not request any more pain medicines at this time.       Jerry CoreNathan Andres Bantz, MD 01/07/16 2016

## 2016-01-08 ENCOUNTER — Observation Stay (HOSPITAL_BASED_OUTPATIENT_CLINIC_OR_DEPARTMENT_OTHER): Payer: PRIVATE HEALTH INSURANCE

## 2016-01-08 ENCOUNTER — Encounter (HOSPITAL_COMMUNITY): Payer: Self-pay | Admitting: Urology

## 2016-01-08 ENCOUNTER — Observation Stay (HOSPITAL_COMMUNITY): Payer: PRIVATE HEALTH INSURANCE

## 2016-01-08 DIAGNOSIS — R0600 Dyspnea, unspecified: Secondary | ICD-10-CM | POA: Diagnosis not present

## 2016-01-08 DIAGNOSIS — J810 Acute pulmonary edema: Secondary | ICD-10-CM

## 2016-01-08 DIAGNOSIS — J811 Chronic pulmonary edema: Secondary | ICD-10-CM

## 2016-01-08 DIAGNOSIS — J9601 Acute respiratory failure with hypoxia: Secondary | ICD-10-CM

## 2016-01-08 HISTORY — DX: Chronic pulmonary edema: J81.1

## 2016-01-08 LAB — ECHOCARDIOGRAM COMPLETE
HEIGHTINCHES: 72 in
Weight: 3231.06 oz

## 2016-01-08 LAB — MRSA PCR SCREENING: MRSA by PCR: NEGATIVE

## 2016-01-08 MED ORDER — OXYCODONE-ACETAMINOPHEN 5-325 MG PO TABS: 1.0000 | ORAL_TABLET | Freq: Four times a day (QID) | ORAL | 0 refills | Status: DC | PRN

## 2016-01-08 MED ORDER — ACETAMINOPHEN 325 MG PO TABS
650.0000 mg | ORAL_TABLET | ORAL | Status: DC | PRN
  Administered 2016-01-08: 650 mg via ORAL
  Filled 2016-01-08: qty 2

## 2016-01-08 MED ORDER — CHLORHEXIDINE GLUCONATE 0.12 % MT SOLN
15.0000 mL | Freq: Two times a day (BID) | OROMUCOSAL | Status: DC
  Administered 2016-01-08: 15 mL via OROMUCOSAL
  Filled 2016-01-08: qty 15

## 2016-01-08 MED ORDER — ORAL CARE MOUTH RINSE
15.0000 mL | Freq: Two times a day (BID) | OROMUCOSAL | Status: DC
  Administered 2016-01-08: 15 mL via OROMUCOSAL

## 2016-01-08 MED ORDER — FUROSEMIDE 10 MG/ML IJ SOLN
40.0000 mg | Freq: Once | INTRAMUSCULAR | Status: AC
  Administered 2016-01-08: 40 mg via INTRAVENOUS
  Filled 2016-01-08: qty 4

## 2016-01-08 MED ORDER — POTASSIUM CHLORIDE CRYS ER 20 MEQ PO TBCR
40.0000 meq | EXTENDED_RELEASE_TABLET | Freq: Once | ORAL | Status: AC
  Administered 2016-01-08: 40 meq via ORAL
  Filled 2016-01-08: qty 2

## 2016-01-08 MED ORDER — SODIUM CHLORIDE 0.9 % IV SOLN
INTRAVENOUS | Status: DC
  Administered 2016-01-08: 01:00:00 via INTRAVENOUS

## 2016-01-08 MED ORDER — OXYCODONE-ACETAMINOPHEN 5-325 MG PO TABS: 1.0000 | ORAL_TABLET | ORAL | Status: DC | PRN

## 2016-01-08 MED ORDER — MORPHINE SULFATE (PF) 10 MG/ML IV SOLN
2.0000 mg | Freq: Once | INTRAVENOUS | Status: AC
  Administered 2016-01-08: 2 mg via INTRAVENOUS

## 2016-01-08 MED ORDER — HYDROMORPHONE HCL 1 MG/ML IJ SOLN: 0.5000 mg | INTRAMUSCULAR | Status: DC | PRN

## 2016-01-08 MED FILL — Ondansetron HCl Inj 4 MG/2ML (2 MG/ML): INTRAMUSCULAR | Qty: 2 | Status: AC

## 2016-01-08 NOTE — Anesthesia Postprocedure Evaluation (Signed)
Anesthesia Post Note  Patient: Jerry Turner  Procedure(s) Performed: Procedure(s) (LRB): CYSTOSCOPY WITH RETROGRADE PYELOGRAM/URETERAL STENT PLACEMENT/STONE REMOVAL WITH BASKET (Left) HOLMIUM LASER APPLICATION (Left)  Patient location during evaluation: PACU Anesthesia Type: General Level of consciousness: awake and alert, oriented and patient cooperative Pain management: pain level controlled Vital Signs Assessment: post-procedure vital signs reviewed and stable Respiratory status: on CPAP 12/6, FIO2 45% Cardiovascular status: blood pressure returned to baseline and stable Postop Assessment: no signs of nausea or vomiting Anesthetic complications: yes Anesthetic complication details: anesthesia complications   Last Vitals:  Vitals:   01/07/16 2354 01/08/16 0000  BP:  (!) 134/98  Pulse: 87 (!) 102  Resp: 20 (!) 27  Temp:      Last Pain:  Vitals:   01/07/16 2135  TempSrc:   PainSc: 2        Jerry Turner likely experienced negative pressure pulmonary edema at the end of the case following emergence and extubation. He initially was coughing, pink tinged fluid was expected and with sats < 80% but was responsive and following commands.. We placed him on 100% oxygen with positive pressure by facemask, gave him 20mg  lasix and got CXR in pacu which is pending.Marland Kitchen. He has bilateral breath sounds. 2mg  morphine for air hunger with good effect, no other pain, HR and BP are stable with no signs of ischemia. On BiPAP at 12/6 at 45% FIO2 he quickly responded and was satting in the 90s.Jerry Turner.          Jerry Turner

## 2016-01-08 NOTE — Consult Note (Signed)
Name: Jerry Turner MRN: [id number] DOB: Sep 26, 1960    ADMISSION DATE:  01/07/2016 CONSULTATION DATE:  9/8  REFERRING MD :  eskridge   CHIEF COMPLAINT:  Acute hypoxic respiratory failure   BRIEF PATIENT DESCRIPTION: 55 year old male admitted w/ symptomatic left ureteral stone. Went for left retrograde pyelogram, left ureteroscopy, holmium laser lithotripsy, stone basket extraction and left ureteral stent placement on 9/7. He was coughing at end of case and developed pink frothy sputum w/ hypoxia. CXR showed R>L airspace disease. He briefly required BIPAP. He was given IV lasix and admitted to the ICU post-op. PCCM was asked to assist w/ post op care.    SIGNIFICANT EVENTS    STUDIES:     PAST MEDICAL HISTORY :   has a past medical history of Kidney stones.  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 Prior to Admission medications   Medication Sig Start Date End Date Taking? Authorizing Provider  Multiple Vitamin (MULTIVITAMIN WITH MINERALS) TABS tablet Take 1 tablet by mouth daily.   Yes Historical Provider, MD  cephALEXin (KEFLEX) 500 MG capsule Take 1 capsule (500 mg total) by mouth 2 (two) times daily. 01/07/16   Jerilee FieldMatthew Eskridge, MD  methocarbamol (ROBAXIN) 500 MG tablet Take 1 tablet (500 mg total) by mouth 3 (three) times daily as needed for muscle spasms. Patient not taking: Reported on 08/12/2015 09/20/13   Beverely LowSteve Norris, MD  oxyCODONE-acetaminophen (PERCOCET) 7.5-325 MG tablet Take 1 tablet by mouth every 6 (six) hours as needed for severe pain. Patient not taking: Reported on 01/07/2016 08/12/15   Fayrene HelperBowie Tran, PA-C  oxyCODONE-acetaminophen (ROXICET) 5-325 MG tablet Take 1-2 tablets by mouth every 6 (six) hours as needed for severe pain. 01/07/16   Jerilee FieldMatthew Eskridge, MD  tamsulosin (FLOMAX) 0.4 MG CAPS capsule Take 1 capsule (0.4 mg total) by mouth daily after supper. 01/07/16   Jerilee FieldMatthew Eskridge, MD   No Known  Allergies  FAMILY HISTORY:  family history is not on file. SOCIAL HISTORY:  reports that he has never smoked. He has never used smokeless tobacco. He reports that he drinks alcohol. He reports that he does not use drugs.  REVIEW OF SYSTEMS:   Constitutional: Negative for fever, chills, weight loss, malaise/fatigue and diaphoresis.  HENT: Negative for hearing loss, ear pain, nosebleeds, congestion, sore throat, neck pain, tinnitus and ear discharge.   Eyes: Negative for blurred vision, double vision, photophobia, pain, discharge and redness.  Respiratory: dry non-productive cough, no further hemoptysis,  shortness of breath, no wheezing and stridor.  chest tightness  Cardiovascular: Negative for chest pain, palpitations, orthopnea, claudication, leg swelling and PND.  Gastrointestinal: Negative for heartburn, nausea, vomiting, abdominal pain, diarrhea, constipation, blood in stool and melena.  Genitourinary: Negative for dysuria, urgency, frequency, hematuria and flank pain.  Musculoskeletal: Negative for myalgias, back pain, joint pain and falls.  Skin: Negative for itching and rash.  Neurological: Negative for dizziness, tingling, tremors, sensory change, speech change, focal weakness, seizures, loss of consciousness, weakness and headaches.  Endo/Heme/Allergies: Negative for environmental allergies and polydipsia. Does not bruise/bleed easily.  SUBJECTIVE:  C/o cough  VITAL SIGNS: Temp:  [98 F (36.7 C)-98.9 F (37.2 C)] 98.5 F (36.9 C) (09/08 0800) Pulse Rate:  [45-102] 72 (09/08 0800) Resp:  [16-33] 28 (09/08 0800) BP: (117-165)/(73-116) 127/73 (09/08 0800) SpO2:  [89 %-100 %] 93 % (09/08 0800) FiO2 (%):  [45 %] 45 % (09/08 0122) Weight:  [200 lb (90.7 kg)-201 lb  15.1 oz (91.6 kg)] 201 lb 15.1 oz (91.6 kg) (09/08 0122) 3 liters  PHYSICAL EXAMINATION: General:  Well developed 55 year old male, no acute distress. Does have on-going dry cough  Neuro:  Awake, oriented. No focal  def  HEENT:  NCAT, no JVD. MMM Cardiovascular:  RRR and w/out MRG Lungs:  Fine crackles posterior no accessory use  Abdomen:  Soft, not tender + bowel sounds  Musculoskeletal:  Equal strength and bulk,  Skin:  Warm and dry    Recent Labs Lab 01/07/16 1218  NA 141  K 4.6  CL 103  BUN 13  CREATININE 1.20  GLUCOSE 141*    Recent Labs Lab 01/07/16 1218  HGB 15.0  HCT 44.0   Dg Chest Port 1 View  Result Date: 01/08/2016 CLINICAL DATA:  Acute onset of shortness of breath. Postoperative radiograph. Initial encounter. EXAM: PORTABLE CHEST 1 VIEW COMPARISON:  None. FINDINGS: Diffuse right-sided and mild left suprahilar airspace opacification is noted, with sparing of the right lung base. Per clinical correlation, given Anesthesiology report, this may reflect negative pressure pulmonary edema, though pneumonia could have a similar appearance. No pleural effusion or pneumothorax is seen. The cardiomediastinal silhouette is normal in size. No acute osseous abnormalities are identified. The patient's right shoulder arthroplasty is grossly unremarkable, though incompletely imaged. IMPRESSION: Diffuse right-sided and mild left suprahilar airspace opacification, with sparing of the right lung base. Per clinical correlation, this may reflect negative pressure pulmonary edema, though pneumonia could have a similar appearance. These results were called by telephone at the time of interpretation on 01/08/2016 at 12:23 am to Nursing at the [facility], who verbally acknowledged these results. Electronically Signed   By: Roanna Raider M.D.   On: 01/08/2016 00:26   Ct Renal Stone Study  Result Date: 01/07/2016 CLINICAL DATA:  Left flank pain of acute onset. History of nephrolithiasis. EXAM: CT ABDOMEN AND PELVIS WITHOUT CONTRAST TECHNIQUE: Multidetector CT imaging of the abdomen and pelvis was performed following the standard protocol without IV contrast. COMPARISON:  08/12/2015 renal sonogram and  06/19/2009 CT abdomen/pelvis. FINDINGS: Lower chest: Subpleural 4 mm right middle lobe pulmonary nodule is stable since 2011 and considered benign. Fluid is seen in the lower third of the thoracic esophagus. Hepatobiliary: Normal liver with no liver mass. Normal gallbladder with no radiopaque cholelithiasis. No biliary ductal dilatation. Pancreas: Normal, with no mass or duct dilation. Spleen: Normal size spleen. Hypodense 1.1 cm inferior splenic lesion is stable since 06/19/2009 and is considered benign. No new splenic lesions. Adrenals/Urinary Tract: Normal adrenals. Nonobstructing 2 mm lower right renal stone. No right hydronephrosis. Normal caliber right ureter, with no right ureteral stones. Obstructing 7 x 5 mm proximal left ureteral stone at the L3-4 level with mild left hydroureteronephrosis and left perinephric fat stranding and ill-defined fluid. No additional left urinary tract stones. Simple 1.0 cm renal cyst in the posterior mid to lower left kidney. No additional contour deforming renal lesions. No bladder stones. No bladder wall thickening. Stomach/Bowel: Grossly normal stomach. Normal caliber small bowel with no small bowel wall thickening. Normal appendix. Normal large bowel with no diverticulosis, large bowel wall thickening or pericolonic fat stranding. Vascular/Lymphatic: Normal caliber abdominal aorta. No pathologically enlarged lymph nodes in the abdomen or pelvis. Reproductive: Normal size prostate. Other: No pneumoperitoneum, ascites or focal fluid collection. Musculoskeletal: No aggressive appearing focal osseous lesions. Mild thoracolumbar spondylosis. IMPRESSION: 1. Obstructing 7 x 5 mm proximal left ureteral stone with mild left hydroureteronephrosis and left perinephric fat  stranding and ill-defined fluid. 2. Nonobstructing punctate lower right renal stone. 3. Fluid in the lower thoracic esophagus, suggesting esophageal dysmotility and/or gastroesophageal reflux. Electronically Signed    By: Delbert Phenix M.D.   On: 01/07/2016 17:35  R>L airspace disease   ASSESSMENT / PLAN:  Acute hypoxic Respiratory failure in the setting of negative pressure pulmonary edema.  -->slowly improving. O2 requirements have improved.  Plan Repeat lasix x1 oob Wean O2 Repeat PCXR around noon today  Will need walking oximetry prior to dc  Left proximal ureteral stone. Now s/p Cystoscopy, left retrograde pyelogram, left ureteroscopy, holmium laser lithotripsy, stone basket extraction and left ureteral stent placement Plan Per urology   Simonne Martinet ACNP-BC [facility] Pulmonary/Critical Care Pager # [phone] OR # [phone] if no answer  01/08/2016, 10:15 AM

## 2016-01-08 NOTE — Progress Notes (Signed)
Patient on BiPAP and taken up to Step Down. I sat with him in recovery. He was alert and oriented. We discussed the stone removal and stent removal on Monday. His HR in 90's and he was satting 97-98%.

## 2016-01-08 NOTE — Progress Notes (Signed)
Pt transferred to ICU room 1229 without incident. Pt remained stable on BiPAP throughout the entire trip.  RT will continue to monitor as needed.

## 2016-01-08 NOTE — Progress Notes (Signed)
Pt without complaint. Feeling better. Less coughing. Mild stent pain when he voids.   Vitals:   01/08/16 1200 01/08/16 1300  BP: 125/89   Pulse: 72 67  Resp: (!) 24 20  Temp: 98.3 F (36.8 C)    NAD A&Ox3 CV - RRR Lungs - reg effort, rate, depth on RA -- sats around 92%  A/P - pulmonary edema - appreciate PCCM - waiting on echo results and ambulation on RA - possible d/c this evening or in AM.

## 2016-01-08 NOTE — Progress Notes (Signed)
  Echocardiogram 2D Echocardiogram has been performed.  Jerry Turner, Jerry Turner 01/08/2016, 3:11 PM

## 2016-01-08 NOTE — Progress Notes (Signed)
[facility] PCCM  Echo reviewed> mild RV dilation and RA dilation, no valvular problems  He reports feeling better, still has a mild cough  He has been walking around the unit on room air and did not desaturate  I explained to him today that I think the mild RV dilation is related to the negative pressure pulmonary edema and not indicative of long-term pathology. 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 He voiced understanding.  Okay for discharge from my standpoint  Jerry CarolinaBrent Jameek Bruntz, MD [place] PCCM Pager: [phone] Cell: [phone] After 3pm or if no response, call [phone]

## 2016-01-08 NOTE — Progress Notes (Signed)
Working Dx: acute negative pressure pulmonary edema (noncardiac edema).. No hx of heart failure or CAD concerning for ischemia/worsening heart failure  Saw Scott this morning. He is doing well. His vital signs are stable. He does have some mid sternal tightness that is irritated with taking a deep breath. His cough is improved and he slept well. He has sats via pulse ox in the low 90s on nasal cannula currently. The plan is to walk around this morning and give a further 10mg  IV lasix. Breathing treatments with B2 agonists may help as well. He diuresed over a liter last night and is feeling better. As his fluid redistributes and he maintains negative balance through urination he should continue to improve through the next 12-24 hours. Should be fully recovered soon from this rare anesthesia event with no known lasting consequences from this insult. We greatly appreciate Pulmonary/Critical Care help.  Mable FillB Sheena Donegan, MD

## 2016-01-08 NOTE — Progress Notes (Addendum)
1 Day Post-Op Subjective: Patient reports feeling some chest tightness and cough. He's satting 92-96 on 6L [place]. Voiding OK.   Objective: Vital signs in last 24 hours: Temp:  [98 F (36.7 C)-98.9 F (37.2 C)] 98.9 F (37.2 C) (09/08 0412) Pulse Rate:  [45-102] 75 (09/08 0700) Resp:  [16-33] 18 (09/08 0700) BP: (117-165)/(79-116) 131/81 (09/08 0600) SpO2:  [89 %-100 %] 98 % (09/08 0700) FiO2 (%):  [45 %] 45 % (09/08 0122) Weight:  [90.7 kg (200 lb)-91.6 kg (201 lb 15.1 oz)] 91.6 kg (201 lb 15.1 oz) (09/08 0122)  Intake/Output from previous day: 09/07 0701 - 09/08 0700 In: 1118.8 [I.V.:1118.8] Out: 1125 [Urine:1125] Intake/Output this shift: No intake/output data recorded.  Physical Exam:  A&Ox3 NAD Resp - nl rate and effort, on 6L [place]  Lab Results:  Recent Labs  01/07/16 1218  HGB 15.0  HCT 44.0   BMET  Recent Labs  01/07/16 1218  NA 141  K 4.6  CL 103  GLUCOSE 141*  BUN 13  CREATININE 1.20   No results for input(s): LABPT, INR in the last 72 hours. No results for input(s): LABURIN in the last 72 hours. Results for orders placed or performed during the hospital encounter of 01/07/16  MRSA PCR Screening     Status: None   Collection Time: 01/08/16 12:30 AM  Result Value Ref Range Status   MRSA by PCR NEGATIVE NEGATIVE Final    Comment:        The GeneXpert MRSA Assay (FDA approved for NASAL specimens only), is one component of a comprehensive MRSA colonization surveillance program. It is not intended to diagnose MRSA infection nor to guide or monitor treatment for MRSA infections.     Studies/Results: Dg Chest Port 1 View  Result Date: 01/08/2016 CLINICAL DATA:  Acute onset of shortness of breath. Postoperative radiograph. Initial encounter. EXAM: PORTABLE CHEST 1 VIEW COMPARISON:  None. FINDINGS: Diffuse right-sided and mild left suprahilar airspace opacification is noted, with sparing of the right lung base. Per clinical correlation, given  Anesthesiology report, this may reflect negative pressure pulmonary edema, though pneumonia could have a similar appearance. No pleural effusion or pneumothorax is seen. The cardiomediastinal silhouette is normal in size. No acute osseous abnormalities are identified. The patient's right shoulder arthroplasty is grossly unremarkable, though incompletely imaged. IMPRESSION: Diffuse right-sided and mild left suprahilar airspace opacification, with sparing of the right lung base. Per clinical correlation, this may reflect negative pressure pulmonary edema, though pneumonia could have a similar appearance. These results were called by telephone at the time of interpretation on 01/08/2016 at 12:23 am to Nursing at the [facility], who verbally acknowledged these results. Electronically Signed   By: Roanna RaiderJeffery  Chang M.D.   On: 01/08/2016 00:26   Ct Renal Stone Study  Result Date: 01/07/2016 CLINICAL DATA:  Left flank pain of acute onset. History of nephrolithiasis. EXAM: CT ABDOMEN AND PELVIS WITHOUT CONTRAST TECHNIQUE: Multidetector CT imaging of the abdomen and pelvis was performed following the standard protocol without IV contrast. COMPARISON:  08/12/2015 renal sonogram and 06/19/2009 CT abdomen/pelvis. FINDINGS: Lower chest: Subpleural 4 mm right middle lobe pulmonary nodule is stable since 2011 and considered benign. Fluid is seen in the lower third of the thoracic esophagus. Hepatobiliary: Normal liver with no liver mass. Normal gallbladder with no radiopaque cholelithiasis. No biliary ductal dilatation. Pancreas: Normal, with no mass or duct dilation. Spleen: Normal size spleen. Hypodense 1.1 cm inferior splenic lesion is stable since 06/19/2009 and is considered  benign. No new splenic lesions. Adrenals/Urinary Tract: Normal adrenals. Nonobstructing 2 mm lower right renal stone. No right hydronephrosis. Normal caliber right ureter, with no right ureteral stones. Obstructing 7 x 5 mm proximal left ureteral  stone at the L3-4 level with mild left hydroureteronephrosis and left perinephric fat stranding and ill-defined fluid. No additional left urinary tract stones. Simple 1.0 cm renal cyst in the posterior mid to lower left kidney. No additional contour deforming renal lesions. No bladder stones. No bladder wall thickening. Stomach/Bowel: Grossly normal stomach. Normal caliber small bowel with no small bowel wall thickening. Normal appendix. Normal large bowel with no diverticulosis, large bowel wall thickening or pericolonic fat stranding. Vascular/Lymphatic: Normal caliber abdominal aorta. No pathologically enlarged lymph nodes in the abdomen or pelvis. Reproductive: Normal size prostate. Other: No pneumoperitoneum, ascites or focal fluid collection. Musculoskeletal: No aggressive appearing focal osseous lesions. Mild thoracolumbar spondylosis. IMPRESSION: 1. Obstructing 7 x 5 mm proximal left ureteral stone with mild left hydroureteronephrosis and left perinephric fat stranding and ill-defined fluid. 2. Nonobstructing punctate lower right renal stone. 3. Fluid in the lower thoracic esophagus, suggesting esophageal dysmotility and/or gastroesophageal reflux. Electronically Signed   By: Delbert Phenix M.D.   On: 01/07/2016 17:35    Assessment/Plan: -left ureteral stone - s/p left ureteroscopic stone extraction with ureteral stent with string. Voiding normally.  -pulmonary edema - appreciate PCCM management - discussed patient with them this AM.    LOS: 0 days   Cheyeanne Roadcap 01/08/2016, 8:13 AM

## 2016-01-11 NOTE — Discharge Summary (Signed)
Physician Discharge Summary  Patient ID: Jerry Turner MRN: [id number] DOB/AGE: Jun 11, 1960 55 y.o.  Admit date: 01/07/2016 Discharge date: 01/11/2016  Admission Diagnoses:  Discharge Diagnoses:  Active Problems:   Pulmonary edema   Discharged Condition: good  Hospital Course: patient was admitted following a left ureteroscopy, laser lithotripsy and ureteral stent for acute respiratory failure thought to be due to negative pressure pulmonary edema. He remained stable and was followed by pulmonary while in hospital. He was cleared for discharge on postop day 1 after an echocardiogram and ambulating on room air without difficulty.  Consults: pulmonary/intensive care  Significant Diagnostic Studies: echocardiogram, chest x-ray  Treatments: surgery: cystoscopy, left ureteroscopy, holmium laser lithotripsy, stent placement (string attached)  Discharge Exam: Blood pressure 127/80, pulse 82, temperature 99.4 F (37.4 C), temperature source Oral, resp. rate 18, height 6' (1.829 m), weight 91.6 kg (201 lb 15.1 oz), SpO2 (!) 89 %. NAD Sitting in bed on RA Resp - nl effort and rate Ext - no CCE   Disposition: 01-Home or Self Care     Medication List    TAKE these medications   cephALEXin 500 MG capsule Commonly known as:  KEFLEX Take 1 capsule (500 mg total) by mouth 2 (two) times daily.   methocarbamol 500 MG tablet Commonly known as:  ROBAXIN Take 1 tablet (500 mg total) by mouth 3 (three) times daily as needed for muscle spasms.   multivitamin with minerals Tabs tablet Take 1 tablet by mouth daily.   oxyCODONE-acetaminophen 7.5-325 MG tablet Commonly known as:  PERCOCET Take 1 tablet by mouth every 6 (six) hours as needed for severe pain. What changed:  Another medication with the same name was added. Make sure you understand how and when to take each.   oxyCODONE-acetaminophen 5-325 MG tablet Commonly known as:  ROXICET Take 1-2 tablets by mouth every 6 (six) hours as  needed for severe pain. What changed:  You were already taking a medication with the same name, and this prescription was added. Make sure you understand how and when to take each.   tamsulosin 0.4 MG Caps capsule Commonly known as:  FLOMAX Take 1 capsule (0.4 mg total) by mouth daily after supper.      Follow-up Information    Rielly Corlett, MD In 4 weeks.   Specialty:  Urology Contact information: [address] [place] [address] [phone]           Signed: Jerilee FieldSKRIDGE, Jerry Turner 01/11/2016, 11:37 AM

## 2019-10-21 DIAGNOSIS — R072 Precordial pain: Secondary | ICD-10-CM

## 2019-10-21 DIAGNOSIS — Z7189 Other specified counseling: Secondary | ICD-10-CM | POA: Insufficient documentation

## 2019-10-21 HISTORY — DX: Precordial pain: R07.2

## 2019-10-21 NOTE — Progress Notes (Signed)
Cardiology Office Note   Date:  10/22/2019   ID:  Jerry Turner, DOB 06-23-1960, MRN [id number]  PCP:  Patient, No Pcp Per  Cardiologist:   No primary care provider on file.   Chief Complaint  Patient presents with  . Chest Pain      History of Present Illness: Jerry Turner is a 59 y.o. male who presents was referred by Farris Has, MD for evaluation of precordial chest pain.  He has no past cardiac history.  There was a history of negative pressure pulmonary edema in 2017 at the time of her procedure for kidney stone.  Echo in 2017 demonstrated an EF of 55 - 60%.  There were no significant valve abnormalities.  He otherwise has had no past cardiac history.  He has been active.  He plays hockey routinely.  He does not get chest discomfort with this.  However, he has a couple of episodes of chest pain in the middle of the night.  This is happened over the last 2 months.  Its been while lying down.  6 out of 10 in intensity.  There is no associated nausea vomiting or diaphoresis.  It would come on buildup and then go away on its own.  He never had this kind of discomfort before.  He did not describe associated nausea vomiting or diaphoresis.  There was no radiation to her jaw or to his arms.  He has not had any PND or orthopnea.  He denies any shortness of breath.  He has had no weight gain or edema.   Past Medical History:  Diagnosis Date  . Kidney stones     Past Surgical History:  Procedure Laterality Date  . CYSTOSCOPY W/ URETERAL STENT PLACEMENT Left 01/07/2016   Procedure: CYSTOSCOPY WITH RETROGRADE PYELOGRAM/URETERAL STENT PLACEMENT/STONE REMOVAL WITH BASKET;  Surgeon: Jerilee Field, MD;  Location: [facility];  Service: Urology;  Laterality: Left;  . HOLMIUM LASER APPLICATION Left 01/07/2016   Procedure: HOLMIUM LASER APPLICATION;  Surgeon: Jerilee Field, MD;  Location: [facility];  Service: Urology;  Laterality: Left;  . KIDNEY STONE SURGERY    . TOTAL SHOULDER  ARTHROPLASTY Right 09/20/2013   Procedure: RIGHT TOTAL SHOULDER ARTHROPLASTY;  Surgeon: Verlee Rossetti, MD;  Location: [facility] OR;  Service: Orthopedics;  Laterality: Right;     Current Outpatient Medications  Medication Sig Dispense Refill  . Multiple Vitamin (MULTIVITAMIN WITH MINERALS) TABS tablet Take 1 tablet by mouth daily.     No current facility-administered medications for this visit.    Allergies:   Patient has no known allergies.    Social History:  The patient  reports that he has never smoked. He has never used smokeless tobacco. He reports current alcohol use. He reports that he does not use drugs.   Family History:  The patient's family history includes Dementia in his father.    ROS:  Please see the history of present illness.   Otherwise, review of systems are positive for none.   All other systems are reviewed and negative.    PHYSICAL EXAM: VS:  BP (!) 148/98   Pulse (!) 51   Temp 97.7 F (36.5 C)   Ht 6' (1.829 m)   Wt 210 lb 12.8 oz (95.6 kg)   SpO2 97%   BMI 28.59 kg/m  , BMI Body mass index is 28.59 kg/m. GENERAL:  Well appearing HEENT:  Pupils equal round and reactive, fundi not visualized, oral mucosa unremarkable NECK:  No jugular venous  distention, waveform within normal limits, carotid upstroke brisk and symmetric, no bruits, no thyromegaly LYMPHATICS:  No cervical, inguinal adenopathy LUNGS:  Clear to auscultation bilaterally BACK:  No CVA tenderness CHEST:  Unremarkable HEART:  PMI not displaced or sustained,S1 and S2 within normal limits, no S3, no S4, no clicks, no rubs, no murmurs ABD:  Flat, positive bowel sounds normal in frequency in pitch, no bruits, no rebound, no guarding, no midline pulsatile mass, no hepatomegaly, no splenomegaly EXT:  2 plus pulses throughout, no edema, no cyanosis no clubbing SKIN:  No rashes no nodules NEURO:  Cranial nerves II through XII grossly intact, motor grossly intact throughout PSYCH:  Cognitively intact,  oriented to person place and time    EKG:  EKG is ordered today. The ekg ordered today demonstrates sinus rhythm, rate 51, left anterior fascicular block, no acute ST-T wave changes.   Recent Labs: No results found for requested labs within last 8760 hours.    Lipid Panel No results found for: CHOL, TRIG, HDL, CHOLHDL, VLDL, LDLCALC, LDLDIRECT    Wt Readings from Last 3 Encounters:  10/22/19 210 lb 12.8 oz (95.6 kg)  01/08/16 201 lb 15.1 oz (91.6 kg)  08/12/15 200 lb (90.7 kg)      Other studies Reviewed: Additional studies/ records that were reviewed today include: Labs. Review of the above records demonstrates:  Please see elsewhere in the note.     ASSESSMENT AND PLAN:  CHEST PAIN:    His chest pain is somewhat atypical.  However, he does have some slight abnormal EKG as well as possible hypertension.  The pretest probability of obstructive coronary disease is low but I would like to screen with a POET (Plain Old Exercise Treadmill).   In addition he needs a coronary calcium score for risk stratification.  HTN:   His blood pressure is borderline.  I have asked him to keep a blood pressure diary and invited him to send these results to me  ABNORMAL EKG: He does have left axis deviation and anterior fascicular block but no symptoms related to this.  He had a discussion about this.  No specific change in therapy or further imaging is indicated.  COVID EDUCATION:  He has had his vaccine.      Current medicines are reviewed at length with the patient today.  The patient does not have concerns regarding medicines.  The following changes have been made:    Labs/ tests ordered today include:   Orders Placed This Encounter  Procedures  . CT CARDIAC SCORING  . Exercise Tolerance Test  . EKG 12-Lead     Disposition:   FU with me as needed.      Signed, Minus Breeding, MD  10/22/2019 5:09 PM    [facility]

## 2019-10-22 ENCOUNTER — Encounter: Payer: Self-pay | Admitting: Cardiology

## 2019-10-22 ENCOUNTER — Other Ambulatory Visit: Payer: Self-pay

## 2019-10-22 ENCOUNTER — Ambulatory Visit (INDEPENDENT_AMBULATORY_CARE_PROVIDER_SITE_OTHER): Payer: 59 | Admitting: Cardiology

## 2019-10-22 VITALS — BP 148/98 | HR 51 | Temp 97.7°F | Ht 72.0 in | Wt 210.8 lb

## 2019-10-22 DIAGNOSIS — Z7189 Other specified counseling: Secondary | ICD-10-CM

## 2019-10-22 DIAGNOSIS — R072 Precordial pain: Secondary | ICD-10-CM | POA: Diagnosis not present

## 2019-10-22 DIAGNOSIS — R079 Chest pain, unspecified: Secondary | ICD-10-CM

## 2019-10-22 NOTE — Patient Instructions (Addendum)
Medication Instructions:  Your physician recommends that you continue on your current medications as directed. Please refer to the Current Medication list given to you today.  *If you need a refill on your cardiac medications before your next appointment, please call your pharmacy*  Lab Work: NONE   Testing/Procedures: Your physician has requested that you have an exercise tolerance test. For further information please visit [email or web address] Please also follow instruction sheet, as given.  CALCIUM SCORE - THIS WILL COST $150 OUT OF POCKET  [facility] AT [address]   Follow-Up: AS NEEDED    [facility] RECOMMENDED FOR BLOOD PRESSURE MACHINE

## 2019-10-23 ENCOUNTER — Telehealth (HOSPITAL_COMMUNITY): Payer: Self-pay

## 2019-10-23 NOTE — Telephone Encounter (Signed)
Encounter complete. 

## 2019-10-25 ENCOUNTER — Other Ambulatory Visit: Payer: Self-pay

## 2019-10-25 ENCOUNTER — Ambulatory Visit (HOSPITAL_COMMUNITY)
Admission: RE | Admit: 2019-10-25 | Discharge: 2019-10-25 | Disposition: A | Discharge status: Home / Self Care | Payer: 59 | Source: Ambulatory Visit | Attending: Cardiology | Admitting: Cardiology

## 2019-10-25 DIAGNOSIS — R9431 Abnormal electrocardiogram [ECG] [EKG]: Secondary | ICD-10-CM

## 2019-10-25 DIAGNOSIS — R072 Precordial pain: Secondary | ICD-10-CM | POA: Diagnosis not present

## 2019-10-25 LAB — EXERCISE TOLERANCE TEST
Estimated workload: 13.7 METS
Exercise duration (min): 12 min
Exercise duration (sec): 1 s
MPHR: 162 {beats}/min
Peak HR: 169 {beats}/min
Percent HR: 104 %
Rest HR: 56 {beats}/min

## 2019-10-31 ENCOUNTER — Other Ambulatory Visit: Payer: Self-pay

## 2019-10-31 ENCOUNTER — Ambulatory Visit (INDEPENDENT_AMBULATORY_CARE_PROVIDER_SITE_OTHER)
Admission: RE | Admit: 2019-10-31 | Discharge: 2019-10-31 | Disposition: A | Discharge status: Home / Self Care | Payer: Self-pay | Source: Ambulatory Visit | Attending: Cardiology | Admitting: Cardiology

## 2019-10-31 DIAGNOSIS — R072 Precordial pain: Secondary | ICD-10-CM

## 2019-11-14 ENCOUNTER — Telehealth: Payer: Self-pay | Admitting: *Deleted

## 2019-11-14 DIAGNOSIS — I712 Thoracic aortic aneurysm, without rupture, unspecified: Secondary | ICD-10-CM

## 2019-11-14 NOTE — Telephone Encounter (Addendum)
Spoke with pt, aware of results and follow up. He has had his lipids checked at his job and will get a copy to [place] for review. Order placed for CTA in one year and recall placed.   ----- Message from Rollene Rotunda, MD sent at 11/10/2019  9:03 PM EDT ----- Mildly elevated coronary calcium score.    He has a mildly enlarged aortic root.  I would like to schedule a CT angiogram in one year to follow up on the size of this.  He also had some low risk non specific nodules in the lungs that can be visualized at that time as well.  I would like to see him in one year after the CT.  I would like to see his lipid profile.  Please order one if he has not had one recently.

## 2020-11-16 ENCOUNTER — Other Ambulatory Visit: Payer: Self-pay | Admitting: Family Medicine

## 2020-11-16 DIAGNOSIS — R413 Other amnesia: Secondary | ICD-10-CM

## 2020-11-17 ENCOUNTER — Encounter: Payer: Self-pay | Admitting: Physician Assistant

## 2020-11-18 ENCOUNTER — Other Ambulatory Visit: Payer: Self-pay | Admitting: Family Medicine

## 2020-11-18 DIAGNOSIS — Z87820 Personal history of traumatic brain injury: Secondary | ICD-10-CM

## 2020-11-18 DIAGNOSIS — R413 Other amnesia: Secondary | ICD-10-CM

## 2020-11-20 ENCOUNTER — Other Ambulatory Visit: Payer: 59

## 2020-11-24 NOTE — Progress Notes (Addendum)
Assessment/Plan:   Monish Haliburton is a 60 y.o. year old male with risk factors including hypertension,  seen today for evaluation of memory loss. MoCA today is 28/30  with deficiencies in delayed recall  2/5, orientation  5/6 . 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   Recommendations:   Memory Loss   MRI brain with/without contrast to assess for underlying structural abnormality and assess vascular load  Neurocognitive testing to further evaluate cognitive concerns and determine underlying cause of memory changes, including potential contribution from sleep, anxiety, or depression  Check B12, TSH Discussed safety both in and out of the home.  Discussed the importance of regular daily schedule with inclusion of crossword puzzles to maintain brain function.  Stay active at least 30 minutes at least 3 times a week.  Naps should be scheduled and should be no longer than 60 minutes and should not occur after 2 PM.  Mediterranean diet is recommended  Folllow up once results above are available   Subjective:   The patient is seen in neurologic consultation at the request of Carolin Coy, MD for the evaluation of memory.  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  He also was reading some materials, which he described it as "not making sense, similar to my experiences in high school when I was playing football and suffered concussions".  He described it as "hieroglyphic, like reading comprehension was missing".  He looked at the computer screen, with a list of more than 20 [place] attorney's names are listed, and he said that he could not recognize some of this attorney's names even though he knew them.  "I could not even know the secretary's name  ".  At home, he began thinking about family members and he could not remember the name of his nephew.  No recurrence since 2 weeks ago. About 10 years ago, he had another concussion during a football game on Thanksgiving, "when the heads collided ", and after the game went to a bar, not feeling great, and looked up began to experience a cold sweat, and then he could not recognize the 8 people that were at the bar with him, although they were his coworkers.  His mood is overall good, although he has obvious stress at work.  He denies any history of depression or irritability.  He sleeps about 7 hours, without vivid dreams or sleepwalking.  He denies any hallucinations or paranoia.  He is independent of bathing and dressing.  He was on no medications until recently, when he began taking her blood pressure pill.  He denies forgetting to take any doses.  He is good with his finances, and denies missing any bills.  He denies leaving objects in unusual places.  his appetite is good and denies trouble swallowing.  He cooks, and denies leaving the stove or the faucet on.  Ambulates without difficulty without walker or cane.  Drive with GPS denies getting lost.   Denies headaches, falls.  Denies double vision, dizziness, focal numbness or tingling, unilateral weakness or tremors. Denies urine incontinence or retention, constipation or diarrhea. Denies anosmia. Denies history of OSA, ETOH  Tobacco. Family History remarkable for dementia in both parents.  No Known Allergies  Current Outpatient Medications  Medication Instructions   losartan (COZAAR) 50 mg, Oral, Daily   Multiple Vitamin (MULTIVITAMIN WITH MINERALS) TABS tablet 1 tablet, Oral, Daily     VITALS:   Vitals:   11/25/20 1045  BP: (!) 145/95  Pulse: (!) 55  SpO2: 99%  Weight: 208 lb 3.2 oz (94.4 kg)  Height: 6' (1.829 m)   No flowsheet data found.  PHYSICAL EXAM   HEENT:  Normocephalic, atraumatic. The mucous membranes are moist. The  superficial temporal arteries are without ropiness or tenderness. Cardiovascular: Regular rate and rhythm. Lungs: Clear to auscultation bilaterally. Neck: There are no carotid bruits noted bilaterally.  NEUROLOGICAL: Montreal Cognitive Assessment  11/25/2020  Visuospatial/ Executive (0/5) 5  Naming (0/3) 3  Attention: Read list of digits (0/2) 2  Attention: Read list of letters (0/1) 1  Attention: Serial 7 subtraction starting at 100 (0/3) 3  Language: Repeat phrase (0/2) 2  Language : Fluency (0/1) 1  Abstraction (0/2) 2  Delayed Recall (0/5) 2  Orientation (0/6) 5  Total 26  Adjusted Score (based on education) 26   No flowsheet data found.  No flowsheet data found.   Orientation:  Alert and oriented to person, place and time. No aphasia or dysarthria. Fund of knowledge is appropriate. Recent memory mildly impaired and remote memory intact.  Attention and concentration are normal.  Able to name objects and repeat phrases. Delayed recall  2/5 Cranial nerves: There is good facial symmetry. Extraocular muscles are intact and visual fields are full to confrontational testing. Speech is fluent and clear. Soft palate rises symmetrically and there is no tongue deviation. Hearing is intact to conversational tone. Tone: Tone is good throughout. Sensation: Sensation is intact to light touch and pinprick throughout. Vibration is intact at the bilateral big toe.There is no extinction with double simultaneous stimulation. There is no sensory dermatomal level identified. Coordination: The patient has no difficulty with RAM's or FNF bilaterally. Normal finger to nose  Motor: Strength is 5/5 in the bilateral upper and lower extremities. There is no pronator drift. There are no fasciculations noted. DTR's: Deep tendon reflexes are 2/4 at the bilateral biceps, triceps, brachioradialis, patella and achilles.  Plantar responses are downgoing bilaterally. Gait and Station: The patient is able to ambulate  without difficulty.The patient is able to heel toe walk without any difficulty.The patient is able to ambulate in a tandem fashion. The patient is able to stand in the Romberg position.     Thank you for allowing [place] the opportunity to participate in the care of this nice patient. Please do not hesitate to contact us for any questions or concerns.   Total time spent on today's visit was 60 minutes, including both face-to-face time and nonface-to-face time.  Time included that spent on review of records (prior notes available to me/labs/imaging if pertinent), discussing treatment and goals, answering patient's questions and coordinating care.  Cc:  Patient, No Pcp Per (Inactive)  [name] 11/25/2020 12:16 PM

## 2020-11-25 ENCOUNTER — Encounter: Payer: Self-pay | Admitting: Physician Assistant

## 2020-11-25 ENCOUNTER — Other Ambulatory Visit: Payer: Self-pay

## 2020-11-25 ENCOUNTER — Ambulatory Visit (INDEPENDENT_AMBULATORY_CARE_PROVIDER_SITE_OTHER): Payer: 59 | Admitting: Physician Assistant

## 2020-11-25 ENCOUNTER — Other Ambulatory Visit (INDEPENDENT_AMBULATORY_CARE_PROVIDER_SITE_OTHER): Payer: 59

## 2020-11-25 VITALS — BP 145/95 | HR 55 | Ht 72.0 in | Wt 208.2 lb

## 2020-11-25 DIAGNOSIS — R413 Other amnesia: Secondary | ICD-10-CM | POA: Diagnosis not present

## 2020-11-25 LAB — TSH: TSH: 1.41 u[IU]/mL (ref 0.35–5.50)

## 2020-11-25 LAB — VITAMIN B12: Vitamin B-12: 457 pg/mL (ref 211–911)

## 2020-11-25 NOTE — Patient Instructions (Addendum)
It was a pleasure to see you today at our office.   Recommendations:  Neurocognitive evaluation at our office Check B12 and TSH at the lab Follow up once the results of the above are available   RECOMMENDATIONS FOR ALL PATIENTS WITH MEMORY PROBLEMS: 1. Continue to exercise (Recommend 30 minutes of walking everyday, or 3 hours every week) 2. Increase social interactions - continue going to Church and enjoy social gatherings with friends and family 3. Eat healthy, avoid fried foods and eat more fruits and vegetables 4. Maintain adequate blood pressure, blood sugar, and blood cholesterol level. Reducing the risk of stroke and cardiovascular disease also helps promoting better memory. 5. Avoid stressful situations. Live a simple life and avoid aggravations. Organize your time and prepare for the next day in anticipation. 6. Sleep well, avoid any interruptions of sleep and avoid any distractions in the bedroom that may interfere with adequate sleep quality 7. Avoid sugar, avoid sweets as there is a strong link between excessive sugar intake, diabetes, and cognitive impairment We discussed the Mediterranean diet, which has been shown to help patients reduce the risk of progressive memory disorders and reduces cardiovascular risk. This includes eating fish, eat fruits and green leafy vegetables, nuts like almonds and hazelnuts, walnuts, and also use olive oil. Avoid fast foods and fried foods as much as possible. Avoid sweets and sugar as sugar use has been linked to worsening of memory function.  There is always a concern of gradual progression of memory problems. If this is the case, then we may need to adjust level of care according to patient needs. Support, both to the patient and caregiver, should then be put into place.      You have been referred for a neuropsychological evaluation (i.e., evaluation of memory and thinking abilities). Please bring someone with you to this appointment if  possible, as it is helpful for the doctor to hear from both you and another adult who knows you well. Please bring eyeglasses and hearing aids if you wear them.    The evaluation will take approximately 3 hours and has two parts:   The first part is a clinical interview with the neuropsychologist (Dr. Merz or Dr. Stewart). During the interview, the neuropsychologist will speak with you and the individual you brought to the appointment.    The second part of the evaluation is testing with the doctor's technician (Dana or Kim). During the testing, the technician will ask you to remember different types of material, solve problems, and answer some questionnaires. Your family member will not be present for this portion of the evaluation.   Please note: We must reserve several hours of the neuropsychologist's time and the psychometrician's time for your evaluation appointment. As such, there is a No-Show fee of $100. If you are unable to attend any of your appointments, please contact our office as soon as possible to reschedule.    FALL PRECAUTIONS: Be cautious when walking. Scan the area for obstacles that may increase the risk of trips and falls. When getting up in the mornings, sit up at the edge of the bed for a few minutes before getting out of bed. Consider elevating the bed at the head end to avoid drop of blood pressure when getting up. Walk always in a well-lit room (use night lights in the walls). Avoid area rugs or power cords from appliances in the middle of the walkways. Use a walker or a cane if necessary and consider physical therapy for   balance exercise. Get your eyesight checked regularly.  FINANCIAL OVERSIGHT: Supervision, especially oversight when making financial decisions or transactions is also recommended.  HOME SAFETY: Consider the safety of the kitchen when operating appliances like stoves, microwave oven, and blender. Consider having supervision and share cooking responsibilities  until no longer able to participate in those. Accidents with firearms and other hazards in the house should be identified and addressed as well.   ABILITY TO BE LEFT ALONE: If patient is unable to contact 911 operator, consider using LifeLine, or when the need is there, arrange for someone to stay with patients. Smoking is a fire hazard, consider supervision or cessation. Risk of wandering should be assessed by caregiver and if detected at any point, supervision and safe proof recommendations should be instituted.  MEDICATION SUPERVISION: Inability to self-administer medication needs to be constantly addressed. Implement a mechanism to ensure safe administration of the medications.   DRIVING: Regarding driving, in patients with progressive memory problems, driving will be impaired. We advise to have someone else do the driving if trouble finding directions or if minor accidents are reported. Independent driving assessment is available to determine safety of driving.   If you are interested in the driving assessment, you can contact the following:  [employer] in [place] [phone]  [facility] [phone]  [facility] [phone]  [facility] [phone] or [phone]    Mediterranean Diet A Mediterranean diet refers to food and lifestyle choices that are based on the traditions of countries located on the [place]. This way of eating has been shown to help prevent certain conditions and improve outcomes for people who have chronic diseases, like kidney disease and heart disease. What are tips for following this plan? Lifestyle  Cook and eat meals together with your family, when possible. Drink enough fluid to keep your urine clear or pale yellow. Be physically active every day. This includes: Aerobic exercise like running or swimming. Leisure activities like gardening, walking, or housework. Get 7-8 hours of sleep each  night. If recommended by your health care provider, drink red wine in moderation. This means 1 glass a day for nonpregnant women and 2 glasses a day for men. A glass of wine equals 5 oz (150 mL). Reading food labels  Check the serving size of packaged foods. For foods such as rice and pasta, the serving size refers to the amount of cooked product, not dry. Check the total fat in packaged foods. Avoid foods that have saturated fat or trans fats. Check the ingredients list for added sugars, such as corn syrup. Shopping  At the grocery store, buy most of your food from the areas near the walls of the store. This includes: Fresh fruits and vegetables (produce). Grains, beans, nuts, and seeds. Some of these may be available in unpackaged forms or large amounts (in bulk). Fresh seafood. Poultry and eggs. Low-fat dairy products. Buy whole ingredients instead of prepackaged foods. Buy fresh fruits and vegetables in-season from local farmers markets. Buy frozen fruits and vegetables in resealable bags. If you do not have access to quality fresh seafood, buy precooked frozen shrimp or canned fish, such as tuna, salmon, or sardines. Buy small amounts of raw or cooked vegetables, salads, or olives from the deli or salad bar at your store. Stock your pantry so you always have certain foods on hand, such as olive oil, canned tuna, canned tomatoes, rice, pasta, and beans. Cooking  Cook foods with extra-virgin olive oil instead of   using butter or other vegetable oils. Have meat as a side dish, and have vegetables or grains as your main dish. This means having meat in small portions or adding small amounts of meat to foods like pasta or stew. Use beans or vegetables instead of meat in common dishes like chili or lasagna. Experiment with different cooking methods. Try roasting or broiling vegetables instead of steaming or sauteing them. Add frozen vegetables to soups, stews, pasta, or rice. Add nuts or seeds  for added healthy fat at each meal. You can add these to yogurt, salads, or vegetable dishes. Marinate fish or vegetables using olive oil, lemon juice, garlic, and fresh herbs. Meal planning  Plan to eat 1 vegetarian meal one day each week. Try to work up to 2 vegetarian meals, if possible. Eat seafood 2 or more times a week. Have healthy snacks readily available, such as: Vegetable sticks with hummus. Greek yogurt. Fruit and nut trail mix. Eat balanced meals throughout the week. This includes: Fruit: 2-3 servings a day Vegetables: 4-5 servings a day Low-fat dairy: 2 servings a day Fish, poultry, or lean meat: 1 serving a day Beans and legumes: 2 or more servings a week Nuts and seeds: 1-2 servings a day Whole grains: 6-8 servings a day Extra-virgin olive oil: 3-4 servings a day Limit red meat and sweets to only a few servings a month What are my food choices? Mediterranean diet Recommended Grains: Whole-grain pasta. Brown rice. Bulgar wheat. Polenta. Couscous. Whole-wheat bread. Oatmeal. Quinoa. Vegetables: Artichokes. Beets. Broccoli. Cabbage. Carrots. Eggplant. Green beans. Chard. Kale. Spinach. Onions. Leeks. Peas. Squash. Tomatoes. Peppers. Radishes. Fruits: Apples. Apricots. Avocado. Berries. Bananas. Cherries. Dates. Figs. Grapes. Lemons. Melon. Oranges. Peaches. Plums. Pomegranate. Meats and other protein foods: Beans. Almonds. Sunflower seeds. Pine nuts. Peanuts. Cod. Salmon. Scallops. Shrimp. Tuna. Tilapia. Clams. Oysters. Eggs. Dairy: Low-fat milk. Cheese. Greek yogurt. Beverages: Water. Red wine. Herbal tea. Fats and oils: Extra virgin olive oil. Avocado oil. Grape seed oil. Sweets and desserts: Greek yogurt with honey. Baked apples. Poached pears. Trail mix. Seasoning and other foods: Basil. Cilantro. Coriander. Cumin. Mint. Parsley. Sage. Rosemary. Tarragon. Garlic. Oregano. Thyme. Pepper. Balsalmic vinegar. Tahini. Hummus. Tomato sauce. Olives. Mushrooms. Limit  these Grains: Prepackaged pasta or rice dishes. Prepackaged cereal with added sugar. Vegetables: Deep fried potatoes (french fries). Fruits: Fruit canned in syrup. Meats and other protein foods: Beef. Pork. Lamb. Poultry with skin. Hot dogs. Bacon. Dairy: Ice cream. Sour cream. Whole milk. Beverages: Juice. Sugar-sweetened soft drinks. Beer. Liquor and spirits. Fats and oils: Butter. Canola oil. Vegetable oil. Beef fat (tallow). Lard. Sweets and desserts: Cookies. Cakes. Pies. Candy. Seasoning and other foods: Mayonnaise. Premade sauces and marinades. The items listed may not be a complete list. Talk with your dietitian about what dietary choices are right for you. Summary The Mediterranean diet includes both food and lifestyle choices. Eat a variety of fresh fruits and vegetables, beans, nuts, seeds, and whole grains. Limit the amount of red meat and sweets that you eat. Talk with your health care provider about whether it is safe for you to drink red wine in moderation. This means 1 glass a day for nonpregnant women and 2 glasses a day for men. A glass of wine equals 5 oz (150 mL). This information is not intended to replace advice given to you by your health care provider. Make sure you discuss any questions you have with your health care provider. Document Released: 12/10/2015 Document Revised: 01/12/2016 Document Reviewed: 12/10/2015 Elsevier Interactive Patient   Education  2017 [employer]     

## 2020-11-26 ENCOUNTER — Telehealth: Payer: Self-pay

## 2020-11-26 NOTE — Telephone Encounter (Signed)
-----   Message from Marcos Eke, PA-C sent at 11/25/2020  3:00 PM EDT ----- Please inform patient B12 is normal at 457 ( ideally above 400 is good) and TSH is normal at 1.41. No intervention is necessary. Thanks

## 2020-11-26 NOTE — Telephone Encounter (Signed)
Pt called and informed that B12 is normal at 457 ( ideally above 400 is good) and TSH is normal at 1.41. Nointervention is necessary

## 2020-12-10 ENCOUNTER — Ambulatory Visit
Admission: RE | Admit: 2020-12-10 | Discharge: 2020-12-10 | Disposition: A | Discharge status: Home / Self Care | Payer: 59 | Source: Ambulatory Visit | Attending: Family Medicine | Admitting: Family Medicine

## 2020-12-10 ENCOUNTER — Other Ambulatory Visit: Payer: Self-pay

## 2020-12-10 DIAGNOSIS — Z87820 Personal history of traumatic brain injury: Secondary | ICD-10-CM

## 2020-12-10 DIAGNOSIS — R413 Other amnesia: Secondary | ICD-10-CM

## 2020-12-10 MED ORDER — GADOBENATE DIMEGLUMINE 529 MG/ML IV SOLN
19.0000 mL | Freq: Once | INTRAVENOUS | Status: AC | PRN
  Administered 2020-12-10: 19 mL via INTRAVENOUS

## 2021-01-11 ENCOUNTER — Telehealth: Payer: Self-pay

## 2021-01-11 NOTE — Telephone Encounter (Signed)
Received BP readings in. Per Dr.Hochrein BP readings are elevated, but we have not seen him in a while- he suggested an appointment to be seen to discuss further.   Will route to scheduling.

## 2021-01-18 DIAGNOSIS — I712 Thoracic aortic aneurysm, without rupture: Secondary | ICD-10-CM | POA: Insufficient documentation

## 2021-01-18 DIAGNOSIS — R9431 Abnormal electrocardiogram [ECG] [EKG]: Secondary | ICD-10-CM | POA: Insufficient documentation

## 2021-01-18 DIAGNOSIS — I10 Essential (primary) hypertension: Secondary | ICD-10-CM | POA: Insufficient documentation

## 2021-01-18 DIAGNOSIS — R931 Abnormal findings on diagnostic imaging of heart and coronary circulation: Secondary | ICD-10-CM | POA: Insufficient documentation

## 2021-01-18 DIAGNOSIS — I7121 Aneurysm of the ascending aorta, without rupture: Secondary | ICD-10-CM

## 2021-01-18 HISTORY — DX: Abnormal findings on diagnostic imaging of heart and coronary circulation: R93.1

## 2021-01-18 HISTORY — DX: Essential (primary) hypertension: I10

## 2021-01-18 HISTORY — DX: Abnormal electrocardiogram (ECG) (EKG): R94.31

## 2021-01-18 HISTORY — DX: Aneurysm of the ascending aorta, without rupture: I71.21

## 2021-01-18 NOTE — Progress Notes (Signed)
Cardiology Office Note   Date:  01/19/2021   ID:  Jerry Turner, DOB Sep 06, 1960, MRN [id number]  PCP:  Patient, No Pcp Per (Inactive)  Cardiologist:   None   No chief complaint on file.     History of Present Illness: Jerry Turner is a 60 y.o. male who presents was referred by No ref. provider found for evaluation of precordial chest pain.  He has no past cardiac history.  There was a history of negative pressure pulmonary edema in 2017 at the time of her procedure for kidney stone.  Echo in 2017 demonstrated an EF of 55 - 60%.  There were no significant valve abnormalities.  He otherwise has had no past cardiac history.  After seein him recently I ordered a calcium score that was 17.  POET (Plain Old Exercise Treadmill) was negative.  His ascending aorta was 40 mm Hg.  Since I last saw him he has had no new problems.  He denies any chest pain.  He has no neck or arm discomfort.  He said no shortness of breath, PND or orthopnea.  He still playing hockey twice a week.   Past Medical History:  Diagnosis Date   Hypertension    Kidney stones     Past Surgical History:  Procedure Laterality Date   CYSTOSCOPY W/ URETERAL STENT PLACEMENT Left 01/07/2016   Procedure: CYSTOSCOPY WITH RETROGRADE PYELOGRAM/URETERAL STENT PLACEMENT/STONE REMOVAL WITH BASKET;  Surgeon: Jerilee Field, MD;  Location: [facility];  Service: Urology;  Laterality: Left;   HOLMIUM LASER APPLICATION Left 01/07/2016   Procedure: HOLMIUM LASER APPLICATION;  Surgeon: Jerilee Field, MD;  Location: [facility];  Service: Urology;  Laterality: Left;   KIDNEY STONE SURGERY     TOTAL SHOULDER ARTHROPLASTY Right 09/20/2013   Procedure: RIGHT TOTAL SHOULDER ARTHROPLASTY;  Surgeon: Verlee Rossetti, MD;  Location: [facility] OR;  Service: Orthopedics;  Laterality: Right;     Current Outpatient Medications  Medication Sig Dispense Refill   losartan (COZAAR) 50 MG tablet Take 50 mg by mouth daily.     Multiple Vitamin (MULTIVITAMIN  WITH MINERALS) TABS tablet Take 1 tablet by mouth daily.     No current facility-administered medications for this visit.    Allergies:   Patient has no known allergies.    ROS:  Please see the history of present illness.   Otherwise, review of systems are positive for none.   All other systems are reviewed and negative.    PHYSICAL EXAM: VS:  BP 124/88   Pulse (!) 50   Ht 6' (1.829 m)   Wt 210 lb (95.3 kg)   SpO2 97%   BMI 28.48 kg/m  , BMI Body mass index is 28.48 kg/m. GENERAL:  Well appearing NECK:  No jugular venous distention, waveform within normal limits, carotid upstroke brisk and symmetric, no bruits, no thyromegaly LUNGS:  Clear to auscultation bilaterally CHEST:  Unremarkable HEART:  PMI not displaced or sustained,S1 and S2 within normal limits, no S3, no S4, no clicks, no rubs, no murmurs ABD:  Flat, positive bowel sounds normal in frequency in pitch, no bruits, no rebound, no guarding, no midline pulsatile mass, no hepatomegaly, no splenomegaly EXT:  2 plus pulses throughout, no edema, no cyanosis no clubbing   EKG:  EKG is ordered today. The ekg ordered today demonstrates sinus rhythm, rate 50, left anterior fascicular block, no acute ST-T wave changes.   Recent Labs: 11/25/2020: TSH 1.41    Lipid Panel No results found for:  CHOL, TRIG, HDL, CHOLHDL, VLDL, LDLCALC, LDLDIRECT    Wt Readings from Last 3 Encounters:  01/19/21 210 lb (95.3 kg)  11/25/20 208 lb 3.2 oz (94.4 kg)  10/22/19 210 lb 12.8 oz (95.6 kg)      Other studies Reviewed: Additional studies/ records that were reviewed today include: None. Review of the above records demonstrates:  Please see elsewhere in the note.     ASSESSMENT AND PLAN:  ELEVATED CORONARY CALCIUM:     He has had no further chest discomfort.   We talked extensively about risk reduction.  No change in therapy.   He will get me a copy of his lipid profile happy happy to review this.  We talked about exercise and  diet.  HTN:   His blood pressure is at target.  No change in therapy.   ABNORMAL EKG:   This has been evaluated with an echocardiogram in the past and with his treadmill.  There are no structural findings and it is unchanged from previous.  No further work-up.   ENLARGED ASCENDING AORTA: I will follow-up with a CT of his aorta in 1 year.   Current medicines are reviewed at length with the patient today.  The patient does not have concerns regarding medicines.  The following changes have been made: None  Labs/ tests ordered today include: None  No orders of the defined types were placed in this encounter.    Disposition:   FU with me 2 years   Signed, Rollene Rotunda, MD  01/19/2021 4:46 PM    [facility]

## 2021-01-19 ENCOUNTER — Other Ambulatory Visit: Payer: Self-pay

## 2021-01-19 ENCOUNTER — Encounter: Payer: Self-pay | Admitting: Cardiology

## 2021-01-19 ENCOUNTER — Ambulatory Visit (INDEPENDENT_AMBULATORY_CARE_PROVIDER_SITE_OTHER): Payer: 59 | Admitting: Cardiology

## 2021-01-19 VITALS — BP 124/88 | HR 50 | Ht 72.0 in | Wt 210.0 lb

## 2021-01-19 DIAGNOSIS — R931 Abnormal findings on diagnostic imaging of heart and coronary circulation: Secondary | ICD-10-CM | POA: Diagnosis not present

## 2021-01-19 DIAGNOSIS — I10 Essential (primary) hypertension: Secondary | ICD-10-CM

## 2021-01-19 DIAGNOSIS — I712 Thoracic aortic aneurysm, without rupture: Secondary | ICD-10-CM

## 2021-01-19 DIAGNOSIS — I7121 Aneurysm of the ascending aorta, without rupture: Secondary | ICD-10-CM

## 2021-01-19 DIAGNOSIS — R9431 Abnormal electrocardiogram [ECG] [EKG]: Secondary | ICD-10-CM

## 2021-01-19 NOTE — Patient Instructions (Signed)
Medication Instructions:  Your physician recommends that you continue on your current medications as directed. Please refer to the Current Medication list given to you today.  *If you need a refill on your cardiac medications before your next appointment, please call your pharmacy*  Testing/Procedures: CTA chest/aorta in September 2023  Follow-Up: At [facility], you and your health needs are our priority.  As part of our continuing mission to provide you with exceptional heart care, we have created designated Provider Care Teams.  These Care Teams include your primary Cardiologist (physician) and Advanced Practice Providers (APPs -  Physician Assistants and Nurse Practitioners) who all work together to provide you with the care you need, when you need it.  We recommend signing up for the patient portal called "MyChart".  Sign up information is provided on this After Visit Summary.  MyChart is used to connect with patients for Virtual Visits (Telemedicine).  Patients are able to view lab/test results, encounter notes, upcoming appointments, etc.  Non-urgent messages can be sent to your provider as well.   To learn more about what you can do with MyChart, go to [email or web address].    Your next appointment:   2 year(s)  The format for your next appointment:   In Person  Provider:   Rollene Rotunda, MD

## 2021-02-25 ENCOUNTER — Ambulatory Visit (INDEPENDENT_AMBULATORY_CARE_PROVIDER_SITE_OTHER): Payer: 59 | Admitting: Psychology

## 2021-02-25 ENCOUNTER — Other Ambulatory Visit: Payer: Self-pay

## 2021-02-25 ENCOUNTER — Encounter: Payer: Self-pay | Admitting: Psychology

## 2021-02-25 ENCOUNTER — Ambulatory Visit: Payer: 59

## 2021-02-25 DIAGNOSIS — Z87820 Personal history of traumatic brain injury: Secondary | ICD-10-CM | POA: Insufficient documentation

## 2021-02-25 DIAGNOSIS — R4189 Other symptoms and signs involving cognitive functions and awareness: Secondary | ICD-10-CM | POA: Diagnosis not present

## 2021-02-25 NOTE — Progress Notes (Signed)
NEUROPSYCHOLOGICAL EVALUATION [place]. [facility] Department of Neurology  Date of Evaluation: February 25, 2021  Reason for Referral:   Haron Beilke is a 60 y.o. right-handed Caucasian male referred by  [name], PA-C , to characterize his current cognitive functioning and assist with diagnostic clarity and treatment planning in the context of subjective cognitive concerns.   Assessment and Plan:   Clinical Impression(s): Mr. Scholz pattern of performance is suggestive of neuropsychological functioning largely within normal limits. He did exhibit an isolated weakness while attempting to retrieve previously learned list-based information after a lengthy delay. However, his performance was appropriate across recognition/consolidation aspects of said list learning task. Performance was also appropriate across all aspects of three other memory tasks, making this isolated weakness less of a concern. While basic attention (Digits Forward) scored in the below average range, his performance on tasks of more complex attention were appropriate and at times quite strong, making this performance less of a concern as well. Overall, performance was largely appropriate without any focal areas of notable cognitive impairment. While it is possible that certain scores in the average range represent a decline from a previously higher degree of functioning, there is no testing available for comparison purposes. Mr. Lacount denied difficulties completing instrumental activities of daily living (ADLs) independently.   Mr. 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 There is certainly no current evidence to warrant concern surrounding an underlying neurodegenerative process such as Alzheimer's disease. However, given his positive family history, continued medical monitoring will be important as  time goes on. I am unsure what may have caused or contributed to his brief confusional episode in July. Theoretically, this could have represented a transient ischemic attack (TIA) given the abrupt nature, relatively quick symptom cessation, and no permanent findings upon neuroimaging. This also may have been caused by an atypical migraine experience or other unknown metabolic abnormality at the time. This is speculative and I do not believe that Mr. Buras should dwell much on this experience, assuming that no further similar events are experienced.  Recommendations: Should he report cognitive and/or functional decline in the future, repeat testing would be warranted at that time. The current evaluation will serve as an excellent baseline to compare future evaluations against.   Mr. Roesler is encouraged to attend to lifestyle factors for brain health (e.g., regular physical exercise, good nutrition habits, regular participation in cognitively-stimulating activities, and general stress management techniques), which are likely to have benefits for both emotional adjustment and cognition. Optimal control of vascular risk factors (including safe cardiovascular exercise and adherence to dietary recommendations) is encouraged. Continued participation in activities which provide mental stimulation and social interaction is also recommended.   Memory can be improved using internal strategies such as rehearsal, repetition, chunking, mnemonics, association, and imagery. External strategies such as written notes in a consistently used memory journal, visual and nonverbal auditory cues such as a calendar on the refrigerator or appointments with alarm, such as on a cell phone, can also help maximize recall.    Because he shows better recall for structured information, he will likely understand and retain new information better if it is presented to him in a meaningful or well-organized manner at the outset, such as  grouping items into meaningful categories or presenting information in an outlined, bulleted, or story format.   To address problems with fluctuating attention, he may wish to consider:   -  Avoiding external distractions when needing to concentrate   -Limiting exposure to fast paced environments with multiple sensory demands   -Writing down complicated information and using checklists   -Attempting and completing one task at a time (i.e., no multi-tasking)   -Verbalizing aloud each step of a task to maintain focus   -Reducing the amount of information considered at one time  Review of Records:   Mr. Bagot was seen by [facility]rology [name], PA-C) on 11/25/2020 for an evaluation of memory loss. About two weeks prior to meeting with Ms. Wertman, he was reading a new contract while at work and halfway across the sentence, he could not see below the line. The letters were said to appear slowly. Information he read was said to "not make sense, similar to my experiences in high school when I was playing football and suffered concussions." He described this experience as "hieroglyphic, like reading comprehension was missing." He also reported looking at a list of more than 20 [facility] attorney names and was unable to recognize several despite knowing them personally. This was described as a single, isolated event with no subsequent recurrence. Mood was described as good. No sleep-related dysfunction was reported. ADLs were described as intact. He did report several concussive experiences throughout his life, often the result of playing sports. Performance on a brief cognitive screening instrument (MOCA) was 26/30. Ultimately, Mr. Hrdlicka was referred for a comprehensive neuropsychological evaluation to characterize his cognitive abilities and to assist with diagnostic clarity and treatment planning.   Brain MRI on 12/10/2020 was unremarkable outside of mild small vessel ischemia.   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   Precordial chest pain 10/21/2019   Pulmonary edema 01/08/2016    Past Surgical History:  Procedure Laterality Date   CYSTOSCOPY W/ URETERAL STENT PLACEMENT Left 01/07/2016   Procedure: CYSTOSCOPY WITH RETROGRADE PYELOGRAM/URETERAL STENT PLACEMENT/STONE REMOVAL WITH BASKET;  Surgeon: Jerilee Field, MD;  Location: [facility] ORS;  Service: Urology;  Laterality: Left;   HOLMIUM LASER APPLICATION Left 01/07/2016   Procedure: HOLMIUM LASER APPLICATION;  Surgeon: Jerilee Field, MD;  Location: [facility];  Service: Urology;  Laterality: Left;   KIDNEY STONE SURGERY     TOTAL SHOULDER ARTHROPLASTY Right 09/20/2013   Procedure: RIGHT TOTAL SHOULDER ARTHROPLASTY;  Surgeon: Verlee Rossetti, MD;  Location: [facility];  Service: Orthopedics;  Laterality: Right;   Current Outpatient Medications:    losartan (COZAAR) 50 MG tablet, Take 50 mg by mouth daily., Disp: , Rfl:    Multiple Vitamin (MULTIVITAMIN WITH MINERALS) TABS tablet, Take 1 tablet by mouth daily., Disp: , Rfl:   Clinical Interview:   The following information was obtained during a clinical interview with Mr. Weathers prior to cognitive testing.  Cognitive Symptoms: Decreased short-term memory: Denied. Mild memory lapses or word finding concerns were thought to be age-appropriate and not impactful.  Decreased long-term memory: Denied. Decreased attention/concentration: Denied. Reduced processing speed: Denied. Difficulties with executive functions: Denied. Difficulties with emotion regulation: Denied. Difficulties with receptive language: Denied. Difficulties with word finding: Endorsed  "occasionally." Decreased visuoperceptual ability: Denied.  Trajectory of deficits: Mr. Schicker denied current cognitive complaints. He reported that the current referral likely stemmed from  his isolated event described above which occurred back in July. No recurrences were reported and he was unsure what may have triggered that event.   Difficulties completing ADLs: Denied.  Additional Medical History: History of traumatic brain injury/concussion: Endorsed. He reported experiencing several concussions throughout his life, with his first known event occurring playing soccer while in high school. He was unsure if any of these events resulted in a brief loss in consciousness. However, he commonly reported feeling dazed and disorientated for a short time afterwards. No persisting deficits were reported.  History of stroke: Denied. History of seizure activity: Denied. History of known exposure to toxins: Denied. Symptoms of chronic pain: Denied. Experience of frequent headaches/migraines: Denied. Frequent instances of dizziness/vertigo: Denied.  Sensory changes: He wears glasses with benefit. Other sensory changes/difficulties (e.g., hearing, taste, or smell) were denied.  Balance/coordination difficulties: Denied. Other motor difficulties: Denied.  Sleep History: Estimated hours obtained each night: 7 hours.  Difficulties falling asleep: Denied. Difficulties staying asleep: Denied. Feels rested and refreshed upon awakening: Endorsed.  History of snoring: Denied. History of waking up gasping for air: Denied. Witnessed breath cessation while asleep: Denied.  History of vivid dreaming: Denied. Excessive movement while asleep: Denied. Instances of acting out his dreams: Denied.  Psychiatric/Behavioral Health History: Depression: He described his current mood as "fine" and denied any previous mental health concerns or diagnoses. Current or remote suicidal ideation, intent, or plan was  denied.  Anxiety: Denied. Mania: Denied. Trauma History: Denied. Visual/auditory hallucinations: Denied. Delusional thoughts: Denied.  Tobacco: Denied. Alcohol: He reported consuming alcohol socially a few times per week. He denied a history of problematic alcohol abuse or dependence.  Recreational drugs: Denied.  Family History: Problem Relation Age of Onset   Dementia Mother    Alzheimer's disease Mother        symptom onset in 71s   Alzheimer's disease Father        symptom onset in 37s   Dementia Father    Alzheimer's disease Paternal Aunt        early-onset; 80s   This information was confirmed by Mr. Barrales.  Academic/Vocational History: Highest level of educational attainment: 19 years. He graduated from Social worker school at [employer]. He described himself as a Designer, multimedia in academic settings. English perhaps represented a relative weakness in earlier academic settings.  History of developmental delay: Denied. History of grade repetition: Denied. Enrollment in special education courses: Denied. History of LD/ADHD: Denied.  Employment: He currently works as a Programme researcher, broadcasting/film/video. Outside of the isolated period of dysfunction described above, he reported no work-related dysfunction.   Evaluation Results:   Behavioral Observations: Mr. Takahashi was unaccompanied, arrived to his appointment on time, and was appropriately dressed and groomed. He appeared alert and oriented. Observed gait and station were within normal limits. Gross motor functioning appeared intact upon informal observation and no abnormal movements (e.g., tremors) were noted. His affect was relaxed and positive. Spontaneous speech was fluent and word finding difficulties were not observed during the clinical interview. Thought processes were coherent, organized, and normal in content. Insight into his cognitive difficulties appeared adequate. During testing, sustained attention was appropriate. Task  engagement was adequate and he persisted when challenged. Overall, Mr. Portela was cooperative with the clinical interview and subsequent testing procedures.   Adequacy of Effort: The validity of neuropsychological testing is limited by the extent to which the individual being tested may be assumed to have exerted adequate effort during testing. Mr. Dobie expressed his intention  to perform to the best of his abilities and exhibited adequate task engagement and persistence. Scores across stand-alone and embedded performance validity measures were within expectation. As such, the results of the current evaluation are believed to be a valid representation of Mr. Keltz current cognitive functioning.  Test Results: Mr. Lingafelter was fully oriented at the time of the current evaluation.  Intellectual abilities based upon educational and vocational attainment were estimated to be in the above average range. Premorbid abilities were estimated to be within the above average range based upon a single-word reading test.   Processing speed was average to above average. Basic attention was below average. More complex attention (e.g., working memory) was average. Executive functioning was average to exceptionally high. Performance on a task assessing safety and judgment was in the average range.   Assessed receptive language abilities were average. Likewise, Mr. Yuhas did not exhibit any difficulties comprehending task instructions and answered all questions asked of him appropriately. Assessed expressive language (e.g., verbal fluency and confrontation naming) was average to above average.     Assessed visuospatial/visuoconstructional abilities were well above average.    Learning (i.e., encoding) of novel verbal and visual information was average. Spontaneous delayed recall (i.e., retrieval) of previously learned information was mildly variable. He exhibited some trouble retrieving a previously learned  list of words. However, performance across all other delayed memory tasks was average and not cause for concern. Retention rates were 97% across a story learning task, 71% across a list learning task, and 100% across a shape learning task. Performance across recognition tasks was average, suggesting evidence for information consolidation.   Results of emotional screening instruments suggested that recent symptoms of generalized anxiety were in the minimal range, while symptoms of depression were within normal limits. A screening instrument assessing recent sleep quality suggested the presence of minimal sleep dysfunction.  Tables of Scores:   Note: This summary of test scores accompanies the interpretive report and should not be considered in isolation without reference to the appropriate sections in the text. Descriptors are based on appropriate normative data and may be adjusted based on clinical judgment. Terms such as "Within Normal Limits" and "Outside Normal Limits" are used when a more specific description of the test score cannot be determined.       Percentile - Normative Descriptor > 98 - Exceptionally High 91-97 - Well Above Average 75-90 - Above Average 25-74 - Average 9-24 - Below Average 2-8 - Well Below Average < 2 - Exceptionally Low       Validity:   DESCRIPTOR       ACS Word Choice: --- --- Within Normal Limits  Dot Counting Test: --- --- Within Normal Limits  NAB EVI: --- --- Within Normal Limits  D-KEFS Color Word Effort Index: --- --- Within Normal Limits       Orientation:      Raw Score Percentile   NAB Orientation, Form 1 29/29 --- ---       Cognitive Screening:      Raw Score Percentile   SLUMS: 29/30 --- ---       Intellectual Functioning:      Standard Score Percentile   Barona Formula Estimated Premorbid IQ 119 90 Above Average        Standard Score Percentile   Test of Premorbid Functioning: 117 87 Above Average       Memory:     NAB Memory Module,  Form 1: Standard Score/ T Score Percentile   Total  Memory Index 94 34 Average  List Learning       Total Trials 1-3 23/36 (46) 34 Average    List B 2/12 (30) 2 Well Below Average    Short Delay Free Recall 7/12 (45) 31 Average    Long Delay Free Recall 5/12 (35) 7 Well Below Average    Retention Percentage 71 (42) 21 Below Average    Recognition Discriminability 9 (51) 54 Average  Shape Learning       Total Trials 1-3 18/27 (52) 58 Average    Delayed Recall 7/9 (55) 69 Average    Retention Percentage 100 (50) 50 Average    Recognition Discriminability 7 (50) 50 Average  Story Learning       Immediate Recall 62/80 (48) 42 Average    Delayed Recall 31/40 (45) 31 Average    Retention Percentage 97 (53) 62 Average  Daily Living Memory       Immediate Recall 45/51 (52) 58 Average    Delayed Recall 15/17 (51) 54 Average    Retention Percentage 88 (47) 38 Average    Recognition Hits 8/10 (43) 25 Average       Attention/Executive Function:     Trail Making Test (TMT): Raw Score (T Score) Percentile     Part A 21 secs.,  0 errors (58) 79 Above Average    Part B 49 secs.,  0 errors (54) 66 Average         Scaled Score Percentile   WAIS-IV Coding: 12 75 Above Average       NAB Attention Module, Form 1: T Score Percentile     Digits Forward 38 12 Below Average    Digits Backwards 53 62 Average       D-KEFS Color-Word Interference Test: Raw Score (Scaled Score) Percentile     Color Naming 27 secs. (11) 63 Average    Word Reading 25 secs. (9) 37 Average    Inhibition 73 secs. (8) 25 Average      Total Errors 2 errors (9) 37 Average    Inhibition/Switching 71 secs. (10) 50 Average      Total Errors 1 error (11) 63 Average       D-KEFS Verbal Fluency Test: Raw Score (Scaled Score) Percentile     Letter Total Correct 38 (10) 50 Average    Category Total Correct 46 (13) 84 Above Average    Category Switching Total Correct 11 (8) 25 Average    Category Switching Accuracy 10 (8) 25  Average      Total Set Loss Errors 0 (13) 84 Above Average      Total Repetition Errors 1 (12) 75 Above Average       [employer] Test: Raw Score Percentile     Categories (trials) 5 (64) >16 Within Normal Limits    Total Errors 5 96 Well Above Average    Perseverative Errors 4 66 Average    Non-Perseverative Errors 1 >99 Exceptionally High    Failure to Maintain Set 0 --- ---       NAB Executive Functions Module, Form 1: T Score Percentile     Judgment 55 69 Average       Language:     Verbal Fluency Test: Raw Score (T Score) Percentile     Phonemic Fluency (FAS) 38 (44) 27 Average    Animal Fluency 23 (53) 62 Average        NAB Language Module, Form 1: T Score Percentile  Auditory Comprehension 54 66 Average    Naming 31/31 (52) 58 Average       Visuospatial/Visuoconstruction:      Raw Score Percentile   Clock Drawing: 10/10 --- Within Normal Limits       NAB Spatial Module, Form 1: T Score Percentile     Figure Drawing Copy 68 96 Well Above Average        Scaled Score Percentile   WAIS-IV Block Design: 15 95 Well Above Average       Mood and Personality:      Raw Score Percentile   Beck Depression Inventory - II: 0 --- Within Normal Limits  PROMIS Anxiety Questionnaire: 7 --- None to Slight       Additional Questionnaires:      Raw Score Percentile   PROMIS Sleep Disturbance Questionnaire: 12 --- None to Slight   Informed Consent and Coding/Compliance:   The current evaluation represents a clinical evaluation for the purposes previously outlined by the referral source and is in no way reflective of a forensic evaluation.   Mr. Strosnider was provided with a verbal description of the nature and purpose of the present neuropsychological evaluation. Also reviewed were the foreseeable risks and/or discomforts and benefits of the procedure, limits of confidentiality, and mandatory reporting requirements of this provider. The patient was given the opportunity to  ask questions and receive answers about the evaluation. Oral consent to participate was provided by the patient.   This evaluation was conducted by Newman Nickels, Ph.D., licensed clinical neuropsychologist. Mr. Rueff completed a clinical interview with Dr. Milbert Coulter, billed as one unit [phone], and 135 minutes of cognitive testing and scoring, billed as one unit [phone] and four additional units [id number]. Psychometrist Shan Levans, B.S., assisted Dr. Milbert Coulter with test administration and scoring procedures. As a separate and discrete service, Dr. Milbert Coulter spent a total of 130 minutes in interpretation and report writing billed as one unit [phone] and one unit [phone].

## 2021-02-25 NOTE — Progress Notes (Signed)
   Psychometrician Note   Cognitive testing was administered to Jerry Turner by Shan Levans, B.S. (psychometrist) under the supervision of Dr. Newman Nickels, Ph.D., licensed psychologist on 02/25/2021. Jerry Turner did not appear overtly distressed by the testing session per behavioral observation or responses across self-report questionnaires. Rest breaks were offered.    The battery of tests administered was selected by Dr. Newman Nickels, Ph.D. with consideration to Jerry Turner current level of functioning, the nature of his symptoms, emotional and behavioral responses during interview, level of literacy, observed level of motivation/effort, and the nature of the referral question. This battery was communicated to the psychometrist. Communication between Dr. Newman Nickels, Ph.D. and the psychometrist was ongoing throughout the evaluation and Dr. Newman Nickels, Ph.D. was immediately accessible at all times. Dr. Newman Nickels, Ph.D. provided supervision to the psychometrist on the date of this service to the extent necessary to assure the quality of all services provided.    Jerry Turner will return within approximately 1-2 weeks for an interactive feedback session with Dr. Milbert Coulter at which time his test performances, clinical impressions, and treatment recommendations will be reviewed in detail. Jerry Turner understands he can contact our office should he require our assistance before this time.  A total of 135 minutes of billable time were spent face-to-face with Jerry Turner by the psychometrist. This includes both test administration and scoring time. Billing for these services is reflected in the clinical report generated by Dr. Newman Nickels, Ph.D.  This note reflects time spent with the psychometrician and does not include test scores or any clinical interpretations made by Dr. Milbert Coulter. The full report will follow in a separate note.

## 2021-03-10 ENCOUNTER — Ambulatory Visit (INDEPENDENT_AMBULATORY_CARE_PROVIDER_SITE_OTHER): Payer: 59 | Admitting: Psychology

## 2021-03-10 ENCOUNTER — Other Ambulatory Visit: Payer: Self-pay

## 2021-03-10 DIAGNOSIS — R4189 Other symptoms and signs involving cognitive functions and awareness: Secondary | ICD-10-CM | POA: Diagnosis not present

## 2021-03-10 DIAGNOSIS — Z87820 Personal history of traumatic brain injury: Secondary | ICD-10-CM | POA: Diagnosis not present

## 2021-03-10 NOTE — Progress Notes (Signed)
   Neuropsychology Feedback Session [name]. [facility] [place] Department of Neurology  Reason for Referral:   Jerry Turner is a 60 y.o. right-handed Caucasian male referred by  [name], PA-C , to characterize his current cognitive functioning and assist with diagnostic clarity and treatment planning in the context of subjective cognitive concerns.   Feedback:   [name] completed a comprehensive neuropsychological evaluation on 02/25/2021. Please refer to that encounter for the full report and recommendations. Briefly, results suggested neuropsychological functioning largely within normal limits. He did exhibit an isolated weakness while attempting to retrieve previously learned list-based information after a lengthy delay. However, his performance was appropriate across recognition/consolidation aspects of said list learning task. Performance was also appropriate across all aspects of three other memory tasks, making this isolated weakness less of a concern. While basic attention (Digits Forward) scored in the below average range, his performance on tasks of more complex attention were appropriate and at times quite strong, making this performance less of a concern as well. Overall, performance was largely appropriate without any focal areas of notable cognitive impairment.  [name] was unaccompanied during the current feedback session. Content of the current session focused on the results of his neuropsychological evaluation. [name] was given the opportunity to ask questions and his questions were answered. He was encouraged to reach out should additional questions arise. A copy of his report was provided at the conclusion of the visit.      20 minutes were spent conducting the current feedback session with [name], billed as one unit [phone].

## 2021-03-19 ENCOUNTER — Ambulatory Visit: Payer: 59 | Admitting: Physician Assistant

## 2021-12-15 ENCOUNTER — Other Ambulatory Visit: Payer: Self-pay | Admitting: *Deleted

## 2021-12-15 DIAGNOSIS — I77810 Thoracic aortic ectasia: Secondary | ICD-10-CM

## 2021-12-15 DIAGNOSIS — Z01812 Encounter for preprocedural laboratory examination: Secondary | ICD-10-CM

## 2022-02-21 ENCOUNTER — Encounter (HOSPITAL_COMMUNITY): Payer: Self-pay

## 2022-02-21 ENCOUNTER — Ambulatory Visit (HOSPITAL_COMMUNITY): Payer: 59

## 2022-12-19 ENCOUNTER — Telehealth: Payer: Self-pay | Admitting: *Deleted

## 2022-12-19 DIAGNOSIS — I77810 Thoracic aortic ectasia: Secondary | ICD-10-CM

## 2022-12-19 NOTE — Telephone Encounter (Signed)
Spoke with pt, he is needing a CTA to follow up on the size of his aorta. Order placed. Patient is scheduled for 01/24/23 @ 11:20 am. He knows to arrive at 11 am with no solid foods 4 hours prior to the scan. Message sent to pre-cert.

## 2023-01-10 ENCOUNTER — Ambulatory Visit: Payer: 59 | Admitting: Cardiology

## 2023-01-24 ENCOUNTER — Ambulatory Visit
Admission: RE | Admit: 2023-01-24 | Discharge: 2023-01-24 | Disposition: A | Discharge status: Home / Self Care | Payer: 59 | Source: Ambulatory Visit | Attending: Cardiology | Admitting: Cardiology

## 2023-01-24 DIAGNOSIS — I77810 Thoracic aortic ectasia: Secondary | ICD-10-CM

## 2023-01-24 MED ORDER — IOPAMIDOL (ISOVUE-370) INJECTION 76%
100.0000 mL | Freq: Once | INTRAVENOUS | Status: AC | PRN
  Administered 2023-01-24: 75 mL via INTRAVENOUS

## 2023-02-04 NOTE — Progress Notes (Unsigned)
  Cardiology Office Note:   Date:  02/06/2023  ID:  Jerry Turner, DOB [birth year], MRN [id number] PCP: Farris Has, MD  [facility] Providers Cardiologist:  None {  History of Present Illness:   Jerry Turner is a 62 y.o. male who presents was referred by No ref. provider found for evaluation of precordial chest pain.  He has no past cardiac history.  Echo in 2017 demonstrated an EF of 55 - 60%.  Calcium score in 2021 was 17 and a POET (Plain Old Exercise Treadmill) was negative.  He had an aorta that was 40 mm.  This was in 2021.   The aorta was still 40 mm on CT in Sept.    Since I last saw him he has done well.  He likes to play hockey.  He does a lot of work in [employer]. The patient denies any new symptoms such as chest discomfort, neck or arm discomfort. There has been no new shortness of breath, PND or orthopnea. There have been no reported palpitations, presyncope or syncope.   ROS: As stated in the HPI and negative for all other systems.  Studies Reviewed:    EKG:   EKG Interpretation Date/Time:  Monday February 06 2023 09:32:52 EDT Ventricular Rate:  51 PR Interval:  194 QRS Duration:  122 QT Interval:  428 QTC Calculation: 394 R Axis:   -73  Text Interpretation: Sinus bradycardia RSR' pattern in V1 Left anterior fascicular block When compared with ECG of 27-Jun-2009 06:55, No significant change since last tracing Confirmed by Rollene Rotunda ([id number]) on 02/06/2023 9:50:23 AM     Risk Assessment/Calculations:              Physical Exam:   VS:  BP 116/78 (BP Location: Left Arm, Patient Position: Sitting, Cuff Size: Normal)   Pulse (!) 59   Ht 6' (1.829 m)   Wt 207 lb (93.9 kg)   SpO2 96%   BMI 28.07 kg/m    Wt Readings from Last 3 Encounters:  02/06/23 207 lb (93.9 kg)  01/19/21 210 lb (95.3 kg)  11/25/20 208 lb 3.2 oz (94.4 kg)     GEN: Well nourished, well developed in no acute distress NECK: No JVD; No carotid bruits CARDIAC: RRR, no murmurs,  rubs, gallops RESPIRATORY:  Clear to auscultation without rales, wheezing or rhonchi  ABDOMEN: Soft, non-tender, non-distended EXTREMITIES:  No edema; No deformity   ASSESSMENT AND PLAN:   ELEVATED CORONARY CALCIUM:     He has a mildly elevated coronary calcium excellent risk reduction.  No change in therapy.    HTN:   His blood pressure is at target.  No change in therapy.   ABNORMAL EKG:   He has a left axis deviation but no other significant abnormalities.  No change in therapy.   ENLARGED ASCENDING AORTA:   The aorta is 40 mm.  I will follow this up in 2 years.     Follow up with me in in two years.   Signed, Rollene Rotunda, MD

## 2023-02-06 ENCOUNTER — Encounter: Payer: Self-pay | Admitting: Cardiology

## 2023-02-06 ENCOUNTER — Ambulatory Visit: Payer: BC Managed Care – PPO | Attending: Cardiology | Admitting: Cardiology

## 2023-02-06 VITALS — BP 116/78 | HR 59 | Ht 72.0 in | Wt 207.0 lb

## 2023-02-06 DIAGNOSIS — R931 Abnormal findings on diagnostic imaging of heart and coronary circulation: Secondary | ICD-10-CM | POA: Diagnosis not present

## 2023-02-06 DIAGNOSIS — I10 Essential (primary) hypertension: Secondary | ICD-10-CM | POA: Diagnosis not present

## 2023-02-06 DIAGNOSIS — I77810 Thoracic aortic ectasia: Secondary | ICD-10-CM | POA: Diagnosis not present

## 2023-02-06 NOTE — Patient Instructions (Signed)
    Follow-Up: At [facility], you and your health needs are our priority.  As part of our continuing mission to provide you with exceptional heart care, we have created designated Provider Care Teams.  These Care Teams include your primary Cardiologist (physician) and Advanced Practice Providers (APPs -  Physician Assistants and Nurse Practitioners) who all work together to provide you with the care you need, when you need it.  We recommend signing up for the patient portal called "MyChart".  Sign up information is provided on this After Visit Summary.  MyChart is used to connect with patients for Virtual Visits (Telemedicine).  Patients are able to view lab/test results, encounter notes, upcoming appointments, etc.  Non-urgent messages can be sent to your provider as well.   To learn more about what you can do with MyChart, go to [email or web address].    Your next appointment:   2 year(s)  Provider:   Rollene Rotunda MD

## 2023-03-29 IMAGING — MR MR MRA HEAD W/O CM
1 series · 11 of 48 positions shown · IV contrast (multihance)
Comparison: No pertinent prior exam.

CLINICAL DATA: Memory loss.  History of multiple concussions.

EXAM:
MRI HEAD WITHOUT AND WITH CONTRAST
MRA HEAD WITHOUT CONTRAST
TECHNIQUE: Multiplanar, multi-echo pulse sequences of the brain and surrounding
structures were acquired without and with intravenous contrast.
Angiographic images of the Circle of Willis were acquired using MRA
technique without intravenous contrast.
CONTRAST:  19mL MULTIHANCE GADOBENATE DIMEGLUMINE 529 MG/ML IV SOLN

[Series 4: [id number]_multi-[id number] · axial · 0.6mm · 0.26mm/px · z∈[-25,+50]mm · 11 of 149 slices shown]
[im 10/149]
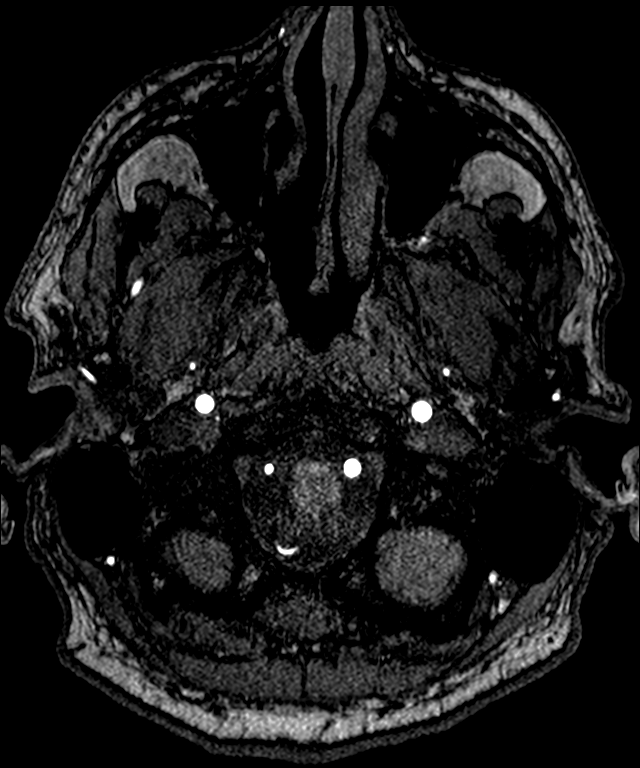
[im 26/149]
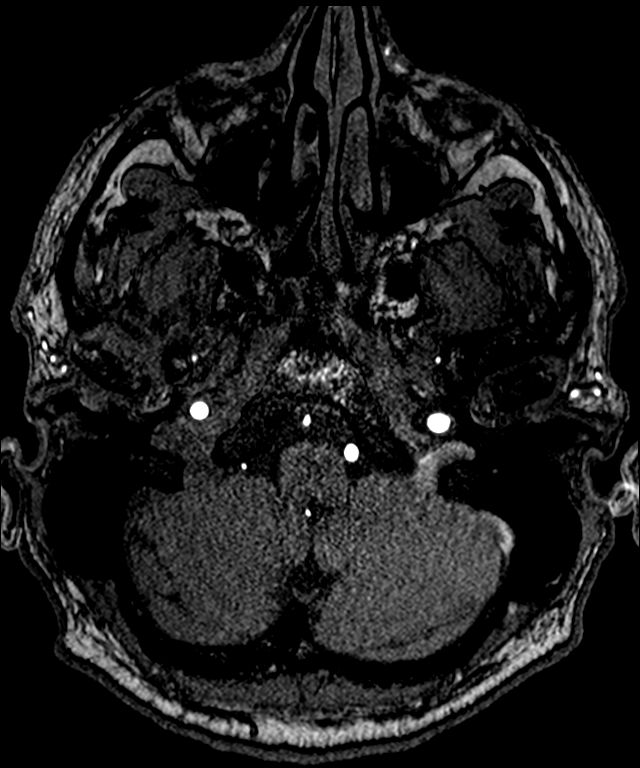
[im 29/149]
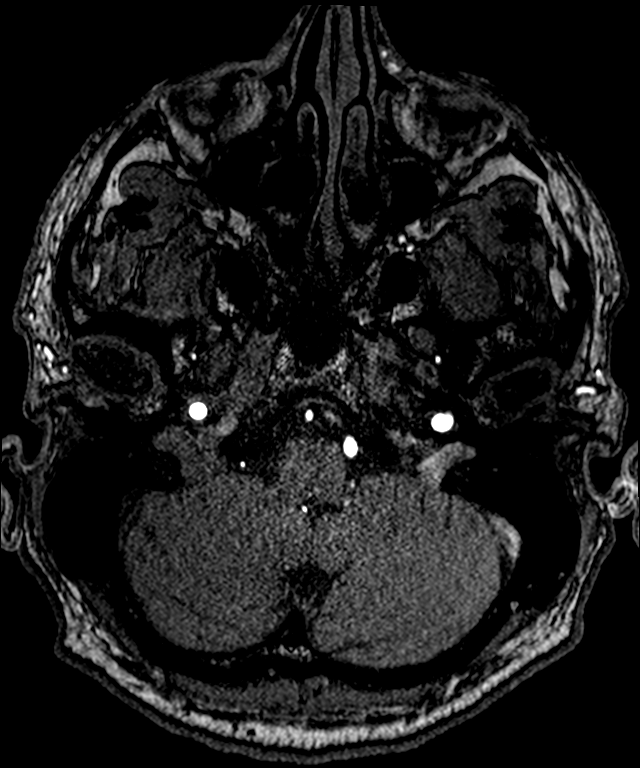
[im 48/149]
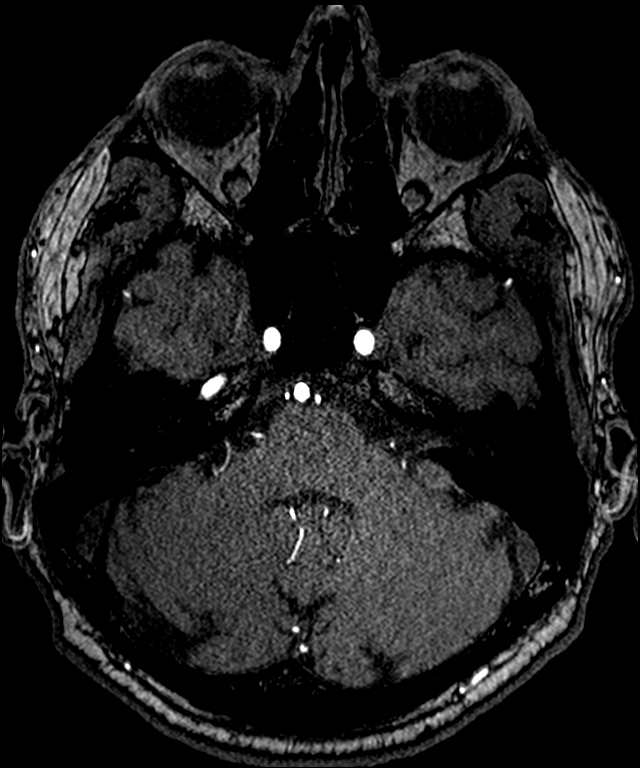
[im 67/149]
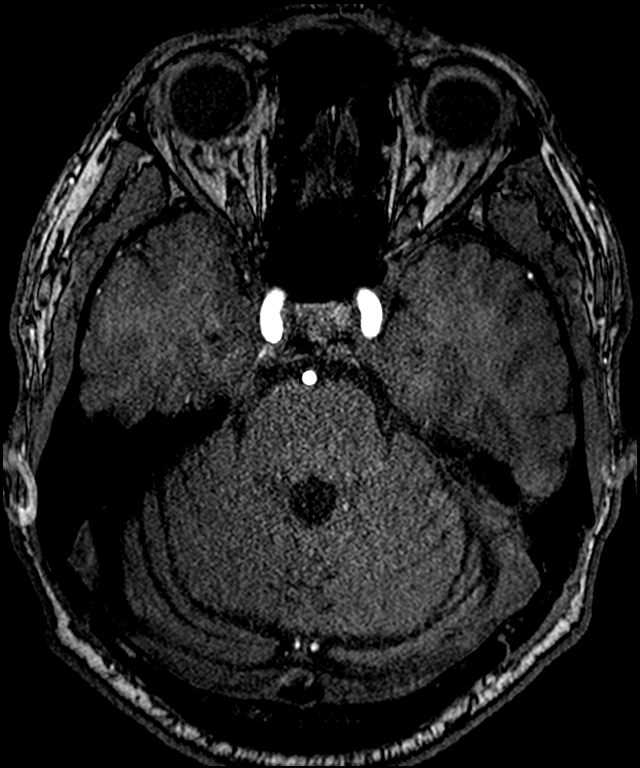
[im 76/149]
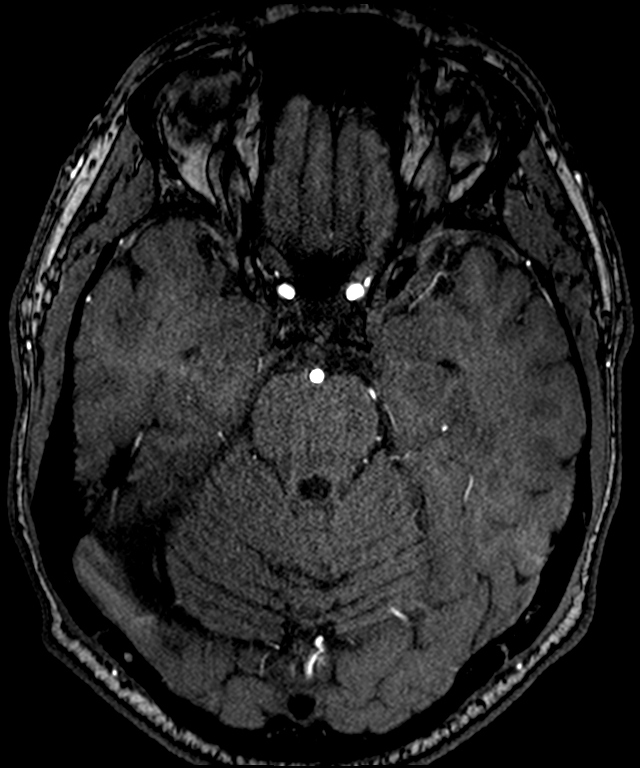
[im 86/149]
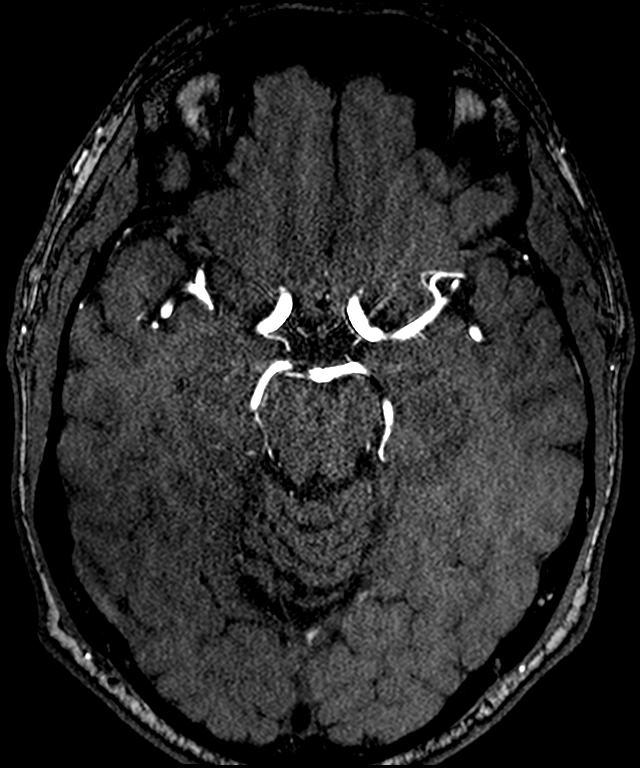
[im 104/149]
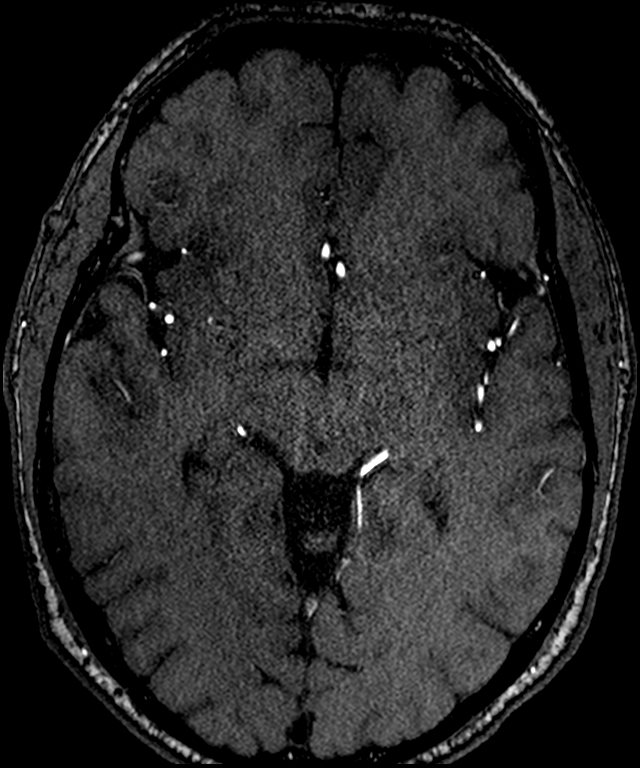
[im 123/149]
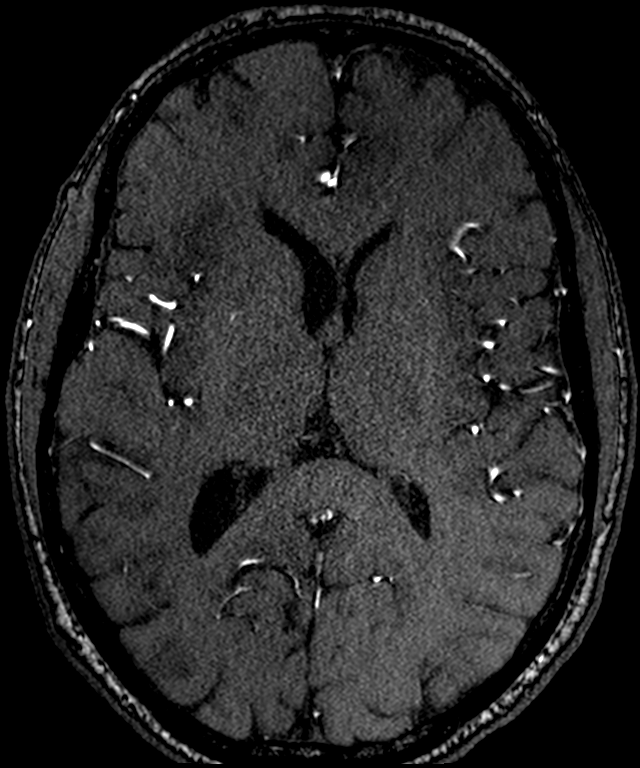
[im 126/149]
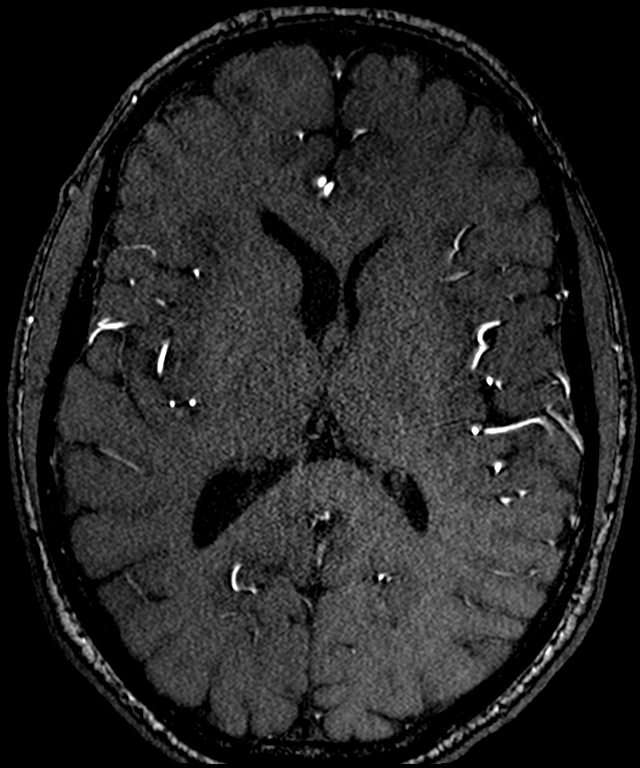
[im 142/149]
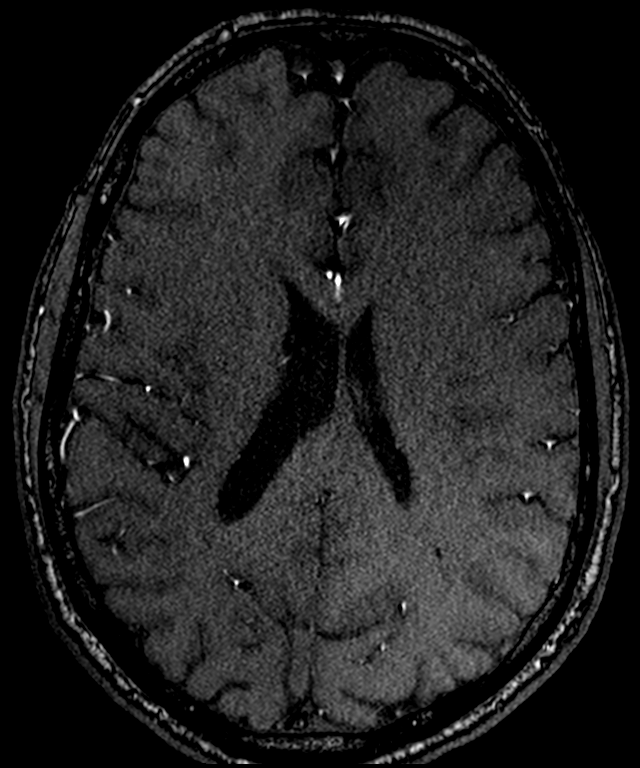

[11 of 48 positions shown; findings below may reference images not displayed]

FINDINGS: MRI HEAD FINDINGS

Brain: Ventricle size and cerebral volume normal. Negative for acute
infarct, hemorrhage, mass. Mild periventricular white matter
hyperintensity bilaterally.

Normal enhancement.

Vascular: Normal arterial flow voids.

Skull and upper cervical spine: No focal skeletal lesion.

Sinuses/Orbits: Negative

Other: None

MRA HEAD FINDINGS

Anterior circulation: Internal carotid artery widely patent.
Anterior and middle cerebral arteries widely patent.

Posterior circulation: Normal posterior circulation without stenosis
or occlusion

Anatomic variants: Negative for cerebral aneurysm
IMPRESSION: 1. No acute intracranial abnormality. Mild periventricular white
matter hyperintensity. Correlate with risk factors for small vessel
ischemia. Otherwise normal.
2. Normal MRA head

## 2023-10-31 ENCOUNTER — Encounter (INDEPENDENT_AMBULATORY_CARE_PROVIDER_SITE_OTHER): Payer: Self-pay

## 2023-12-21 ENCOUNTER — Encounter (INDEPENDENT_AMBULATORY_CARE_PROVIDER_SITE_OTHER): Payer: Self-pay | Admitting: Physician Assistant

## 2023-12-21 ENCOUNTER — Ambulatory Visit (INDEPENDENT_AMBULATORY_CARE_PROVIDER_SITE_OTHER): Admitting: Physician Assistant

## 2023-12-21 ENCOUNTER — Ambulatory Visit (INDEPENDENT_AMBULATORY_CARE_PROVIDER_SITE_OTHER): Admitting: Audiology

## 2023-12-21 VITALS — BP 120/82 | HR 51

## 2023-12-21 DIAGNOSIS — R42 Dizziness and giddiness: Secondary | ICD-10-CM

## 2023-12-21 DIAGNOSIS — H903 Sensorineural hearing loss, bilateral: Secondary | ICD-10-CM | POA: Diagnosis not present

## 2023-12-21 MED ORDER — MECLIZINE HCL 25 MG PO TABS: 25.0000 mg | ORAL_TABLET | Freq: Three times a day (TID) | ORAL | 3 refills | Status: AC | PRN

## 2023-12-21 MED ORDER — ONDANSETRON 4 MG PO TBDP: 4.0000 mg | ORAL_TABLET | Freq: Three times a day (TID) | ORAL | 3 refills | Status: AC | PRN

## 2023-12-21 MED ORDER — DIAZEPAM 2 MG PO TABS: 2.0000 mg | ORAL_TABLET | Freq: Four times a day (QID) | ORAL | 0 refills | Status: AC | PRN

## 2023-12-21 MED ORDER — LORATADINE 10 MG PO TABS: 10.0000 mg | ORAL_TABLET | Freq: Every day | ORAL | 11 refills | Status: AC

## 2023-12-21 MED ORDER — FLUTICASONE PROPIONATE 50 MCG/ACT NA SUSP: 2.0000 | Freq: Every day | NASAL | 6 refills | Status: AC

## 2023-12-21 NOTE — Progress Notes (Signed)
  [address], [address] [place], [place] [address] [phone]  Audiological Evaluation    Name: Jerry Turner     DOB:   November 16, 1960      MRN:   [id number]                                                                                     Service Date: 12/21/2023     Accompanied by: unaccompanied   Patient comes today after [name], PA-C sent a referral for a hearing evaluation due to concerns with dizziness.   Symptoms Yes Details  Hearing loss  []    Tinnitus  [x]  Longstanding- ringing in both ears  Ear pain/ infections/pressure  []    Balance problems  [x]  Dizzy ( denied spinning) spells with headaches and nausea. Onset was 3 days after a flight in June 2025. On July 30th he thinks doing the Epley made  it worse.  Noise exposure history  [x]  Some outside work  Previous ear surgeries  []    Family history of hearing loss  [x]  Father with age  Amplification  []    Other  []      Otoscopy: Right ear: Clear external ear canal and notable landmarks visualized on the tympanic membrane. Left ear:  Clear external ear canal and notable landmarks visualized on the tympanic membrane.  Tympanometry: Right ear: Type A- Normal external ear canal volume with normal middle ear pressure and tympanic membrane compliance. Left ear: Type A- Normal external ear canal volume with normal middle ear pressure and tympanic membrane compliance.   Pure tone Audiometry: Right ear- Normal hearing from [phone], then mild sensorineural hearing loss from 4000 Hz - 8000 Hz. Left ear-  Normal hearing from [phone], then mild to moderate sensorineural hearing loss from 4000 Hz - 8000 Hz.  Speech Audiometry: Right ear- Speech Reception Threshold (SRT) was obtained at 20 dBHL. Left ear-Speech Reception Threshold (SRT) was obtained at 15 dBHL.   Word Recognition Score Tested using NU-6 (recorded) Right ear: 100% was obtained at a presentation level of 55 dBHL with contralateral masking  which is deemed as  excellent. Left ear: 100% was obtained at a presentation level of 55 dBHL with contralateral masking which is deemed as  excellent.   The hearing test results were completed under headphones and re-checked with inserts and results are deemed to be of good to fair reliability. Test technique:  conventional     Recommendations: Follow up with ENT as scheduled for today. Return for a hearing evaluation if concerns with hearing changes arise or per MD recommendation.   Lusine Corlett MARIE LEROUX-MARTINEZ, AUD

## 2023-12-21 NOTE — Progress Notes (Signed)
 Dear Dr. Chrystal, Here is my assessment for our mutual patient, Jerry Turner. Thank you for allowing me the opportunity to care for your patient. Please do not hesitate to contact me should you have any other questions. Sincerely, Chyrl Cohen PA-C  Otolaryngology Clinic Note Referring provider: Dr. Chrystal HPI:  Jerry Turner is a 63 y.o. male kindly referred by Dr. Chrystal   The patient is a 63 year old gentleman seen in our office for evaluation of tinnitus and vertigo.  The patient notes that in May 2025, approximately 3 months ago he had pressure in his left ear prior to plane travel overseas.  He notes that on the plane he noticed some decreased hearing out of the left ear, he felt the sensation of fullness, no significant pain.SABRA  He notes he felt the symptoms for approximately 3 days after landing with continued fullness in the left ear.  He notes approximately 3 days after returning he had an episode of vertigo.  He describes this is room spinning, nausea, no associated neurologic deficits, no changes in hearing, no changes in ringing.  He notes that he has had several episodes since that time.  He is uncertain of any instigating cause, he notes the symptoms are worse with movements of the head.  He notes they last approximately 1 day and then generally he is back to baseline the day after with some minimal lingering symptoms.  He notes that he did have an episode at the end of July where he attempted the Epley maneuver, this made it severely worse.  He notes some minimal tunnel tinnitus bilateral, this is not severe, this does not affect his quality of life.  He denies any head or neck trauma, he does note history of concussions from sports.  He denies any preceding ear related complaints.   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   Precordial chest pain 10/21/2019   Pulmonary edema 01/08/2016     Past Surgical History:  Procedure Laterality Date   CYSTOSCOPY W/ URETERAL STENT PLACEMENT Left 01/07/2016   Procedure: CYSTOSCOPY WITH RETROGRADE PYELOGRAM/URETERAL STENT PLACEMENT/STONE REMOVAL WITH BASKET;  Surgeon: Donnice Brooks, MD;  Location: [facility];  Service: Urology;  Laterality: Left;   HOLMIUM LASER APPLICATION Left 01/07/2016   Procedure: HOLMIUM LASER APPLICATION;  Surgeon: Donnice Brooks, MD;  Location: [facility];  Service: Urology;  Laterality: Left;   KIDNEY STONE SURGERY     TOTAL SHOULDER ARTHROPLASTY Right 09/20/2013   Procedure: RIGHT TOTAL SHOULDER ARTHROPLASTY;  Surgeon: Elspeth JONELLE Her, MD;  Location: [facility];  Service: Orthopedics;  Laterality: Right;    Family History  Problem Relation Age of Onset   Dementia Mother    Alzheimer's disease Mother        symptom onset in 37s   Alzheimer's disease Father        symptom onset in 45s   Dementia Father    Alzheimer's disease Paternal Aunt        early-onset; 20s     Social Connections: Not on file      Current Outpatient Medications:  losartan (COZAAR) 50 MG tablet, Take 50 mg by mouth daily., Disp: , Rfl:    Multiple Vitamin (MULTIVITAMIN WITH MINERALS) TABS tablet, Take 1 tablet by mouth daily., Disp: , Rfl:    Physical Exam:   BP 120/82   Pulse (!) 51   SpO2 96%   Pertinent Findings  CN II-XII intact- no nystagmus  Bilateral EAC clear and TM intact with well pneumatized middle ear spaces Anterior rhinoscopy: Septum midline; bilateral inferior turbinates with no hypertrophy No lesions of oral cavity/oropharynx; dentition  wnl No obviously palpable neck masses/lymphadenopathy/thyromegaly No respiratory distress or stridor  Seprately Identifiable Procedures:  None  Impression & Plans:  Jerry Turner is a 64 y.o. male with the following   Vertigo-  Patient presents with symptoms of vertigo.  His symptoms are most consistent with benign paroxysmal positional vertigo, significantly worsened with head movements.  These generally last approximately a day with complete resolution.  I have very low suspicion for Mnire's, no associated hearing loss or ringing.  No preceding infection or hearing loss to indicate labyrinthitis or vestibular neuritis.  No history of migraines.  No other associated neurologic deficits to indicate a central cause source.  At this time I would recommend vestibular rehab, I have also sent him in a prescription for meclizine , Zofran , and Valium  as needed.  He also describes some fullness and decreased hearing out of his left ear that was worsened with air travel, he likely has a level of eustachian tube dysfunction.  I have recommended Flonase  and daily antihistamine.  His audiogram was reassuring with minimal reduction in sensorineural hearing loss from approximately 3000 Hz through 8000 Hz, this was symmetric with bilateral type a tympanometry.  At this time the patient may follow-up on a as needed basis if he develops any new or worsening signs or symptoms.  He verbalized understanding and agreement to today's plan had no further questions or concerns.   - f/u PRN   Thank you for allowing me the opportunity to care for your patient. Please do not hesitate to contact me should you have any other questions.  Sincerely, Chyrl Cohen PA-C [facility] Phone: [phone] Fax: [phone]  12/21/2023, 9:55 AM
